# Patient Record
Sex: Female | Born: 1935 | Race: White | Hispanic: No | Marital: Married | Smoking: Former smoker
Health system: Southern US, Community
[De-identification: ages and names within clinical notes are randomized; demographics above are authoritative.]

## PROBLEM LIST (undated history)

## (undated) DIAGNOSIS — H269 Unspecified cataract: Secondary | ICD-10-CM

## (undated) DIAGNOSIS — Z86010 Personal history of colonic polyps: Secondary | ICD-10-CM

## (undated) DIAGNOSIS — N390 Urinary tract infection, site not specified: Secondary | ICD-10-CM

## (undated) DIAGNOSIS — J449 Chronic obstructive pulmonary disease, unspecified: Secondary | ICD-10-CM

## (undated) DIAGNOSIS — E079 Disorder of thyroid, unspecified: Secondary | ICD-10-CM

## (undated) DIAGNOSIS — Z860101 Personal history of adenomatous and serrated colon polyps: Secondary | ICD-10-CM

## (undated) DIAGNOSIS — I712 Thoracic aortic aneurysm, without rupture, unspecified: Secondary | ICD-10-CM

## (undated) DIAGNOSIS — Z8542 Personal history of malignant neoplasm of other parts of uterus: Secondary | ICD-10-CM

## (undated) DIAGNOSIS — T7840XA Allergy, unspecified, initial encounter: Secondary | ICD-10-CM

## (undated) DIAGNOSIS — I5030 Unspecified diastolic (congestive) heart failure: Secondary | ICD-10-CM

## (undated) DIAGNOSIS — F419 Anxiety disorder, unspecified: Secondary | ICD-10-CM

## (undated) DIAGNOSIS — I10 Essential (primary) hypertension: Secondary | ICD-10-CM

## (undated) DIAGNOSIS — K5900 Constipation, unspecified: Secondary | ICD-10-CM

## (undated) DIAGNOSIS — M1990 Unspecified osteoarthritis, unspecified site: Secondary | ICD-10-CM

## (undated) DIAGNOSIS — I351 Nonrheumatic aortic (valve) insufficiency: Secondary | ICD-10-CM

## (undated) DIAGNOSIS — J439 Emphysema, unspecified: Secondary | ICD-10-CM

## (undated) DIAGNOSIS — J45909 Unspecified asthma, uncomplicated: Secondary | ICD-10-CM

## (undated) DIAGNOSIS — C4490 Unspecified malignant neoplasm of skin, unspecified: Secondary | ICD-10-CM

## (undated) DIAGNOSIS — E785 Hyperlipidemia, unspecified: Secondary | ICD-10-CM

## (undated) DIAGNOSIS — I729 Aneurysm of unspecified site: Secondary | ICD-10-CM

## (undated) DIAGNOSIS — I2510 Atherosclerotic heart disease of native coronary artery without angina pectoris: Secondary | ICD-10-CM

## (undated) HISTORY — PX: TOE SURGERY: SHX1073

## (undated) HISTORY — PX: SHOULDER ARTHROSCOPY W/ CAPSULAR REPAIR: SHX2398

## (undated) HISTORY — PX: CHOLECYSTECTOMY: SHX55

## (undated) HISTORY — PX: ABDOMINAL HYSTERECTOMY: SHX81

---

## 1983-10-23 HISTORY — PX: BREAST CYST ASPIRATION: SHX578

## 2004-12-25 ENCOUNTER — Ambulatory Visit: Payer: Self-pay | Admitting: Obstetrics and Gynecology

## 2004-12-29 ENCOUNTER — Ambulatory Visit: Payer: Self-pay | Admitting: Obstetrics and Gynecology

## 2006-02-21 ENCOUNTER — Ambulatory Visit: Payer: Self-pay | Admitting: Obstetrics and Gynecology

## 2007-03-20 ENCOUNTER — Ambulatory Visit: Payer: Self-pay | Admitting: Obstetrics and Gynecology

## 2007-10-22 ENCOUNTER — Emergency Department: Payer: Self-pay | Admitting: Emergency Medicine

## 2007-10-22 ENCOUNTER — Other Ambulatory Visit: Payer: Self-pay

## 2008-05-18 ENCOUNTER — Ambulatory Visit: Payer: Self-pay | Admitting: Obstetrics and Gynecology

## 2009-05-20 ENCOUNTER — Ambulatory Visit: Payer: Self-pay | Admitting: Obstetrics and Gynecology

## 2010-06-08 ENCOUNTER — Ambulatory Visit: Payer: Self-pay | Admitting: Obstetrics and Gynecology

## 2010-08-14 ENCOUNTER — Ambulatory Visit: Payer: Self-pay | Admitting: Unknown Physician Specialty

## 2010-08-16 LAB — PATHOLOGY REPORT

## 2010-09-16 ENCOUNTER — Ambulatory Visit: Payer: Self-pay | Admitting: Internal Medicine

## 2011-09-19 ENCOUNTER — Ambulatory Visit: Payer: Self-pay | Admitting: Obstetrics and Gynecology

## 2012-04-17 ENCOUNTER — Ambulatory Visit: Payer: Self-pay | Admitting: Internal Medicine

## 2012-11-10 ENCOUNTER — Ambulatory Visit: Payer: Self-pay | Admitting: Ophthalmology

## 2012-11-18 ENCOUNTER — Ambulatory Visit: Payer: Self-pay | Admitting: Ophthalmology

## 2013-01-20 ENCOUNTER — Ambulatory Visit: Payer: Self-pay | Admitting: Ophthalmology

## 2013-02-24 ENCOUNTER — Ambulatory Visit: Payer: Self-pay | Admitting: Obstetrics and Gynecology

## 2014-01-22 ENCOUNTER — Ambulatory Visit: Payer: Self-pay | Admitting: Vascular Surgery

## 2014-04-22 ENCOUNTER — Emergency Department: Payer: Self-pay | Admitting: Emergency Medicine

## 2014-07-13 ENCOUNTER — Encounter: Payer: Self-pay | Admitting: Specialist

## 2014-07-22 ENCOUNTER — Encounter: Payer: Self-pay | Admitting: Specialist

## 2014-08-22 ENCOUNTER — Encounter: Payer: Self-pay | Admitting: Specialist

## 2014-09-01 ENCOUNTER — Ambulatory Visit: Payer: Self-pay | Admitting: Obstetrics and Gynecology

## 2014-09-21 ENCOUNTER — Encounter: Payer: Self-pay | Admitting: Specialist

## 2014-10-22 ENCOUNTER — Encounter: Payer: Self-pay | Admitting: Specialist

## 2015-01-24 ENCOUNTER — Ambulatory Visit
Admit: 2015-01-24 | Disposition: A | Discharge status: Home / Self Care | Payer: Self-pay | Attending: Family Medicine | Admitting: Family Medicine

## 2015-02-11 NOTE — Op Note (Signed)
PATIENT NAMEJELENA, Danielle Williamson MR#:  [id number] DATE OF BIRTH:  [birth year]  DATE OF PROCEDURE:  01/20/2013  PREOPERATIVE DIAGNOSIS:  Visually significant cataract of the right eye.   POSTOPERATIVE DIAGNOSIS:  Visually significant cataract of the right eye.   OPERATIVE PROCEDURE:  Cataract extraction by phacoemulsification with implant of intraocular lens to the right eye.   SURGEON:  Birder Robson, MD  ANESTHESIA:  1. Managed anesthesia care.  2. 50-50 mixture of 0.75% bupivacaine and 4% Xylocaine given as a retrobulbar block.   COMPLICATIONS:  None.   TECHNIQUE:  Stop-and-chop.  DESCRIPTION OF PROCEDURE:  The patient was examined and consented for this procedure in the preoperative holding area and then brought back to the Operating Room where the anesthesia team employed managed anesthesia care.  3.5 milliliters of the aforementioned mixture were placed in the right orbit on an Atkinson needle without complication. The right eye was then prepped and draped in the usual sterile ophthalmic fashion. A lid speculum was placed. The side-port blade was used to create a paracentesis and the anterior chamber was filled with viscoelastic. The keratome was used to create a near clear corneal incision. The continuous curvilinear capsulorrhexis was performed with a cystotome followed by the capsulorrhexis forceps. Hydrodissection and hydrodelineation were carried out with BSS on a blunt cannula. The lens was removed in a stop-and-chop technique. The remaining cortical material was removed with the irrigation-aspiration handpiece. The capsular bag was inflated with viscoelastic and the Tecnis ZCB00 22.0-diopter lens, serial number [id number] was placed in the capsular bag without complication. The remaining viscoelastic was removed from the eye with the irrigation-aspiration handpiece. The wounds were hydrated. The anterior chamber was flushed with Miostat and the eye was inflated to a physiologic  pressure. Intracameral cefuroxime was not used due to a severe penicillin allergy. The wounds were found to be water tight. The eye was dressed with Vigamox followed by Maxitrol ointment and a protective shield was placed. The patient will follow up with me in one day.    ____________________________ [name]. Quetzally Callas, MD wlp:si D: 01/20/2013 22:03:50 ET T: 01/21/2013 00:21:11 ET JOB#: [id number]  cc: Brodey Bonn L. Johnna Bollier, MD, <Dictator> [name] Amiaya Mcneeley MD ELECTRONICALLY SIGNED 01/21/2013 10:30

## 2015-02-11 NOTE — Op Note (Signed)
PATIENT NAMEMURIAH, Danielle Williamson MR#:  [id number] DATE OF BIRTH:  Feb 11, 1932  DATE OF PROCEDURE:  11/18/2012  PREOPERATIVE DIAGNOSIS: Visually significant cataract of the left eye.   POSTOPERATIVE DIAGNOSIS: Visually significant cataract of the left eye.   OPERATIVE PROCEDURE: Cataract extraction by phacoemulsification with implant of intraocular lens to the left eye.   SURGEON: Birder Robson, MD  ANESTHESIA:  1. Managed anesthesia care.  2. 50-50 mixture of 0.75% bupivacaine and 4% Xylocaine given as a retrobulbar block.   COMPLICATIONS: None.   TECHNIQUE:  Stop and chop.   DESCRIPTION OF PROCEDURE: The patient was examined and consented for this procedure in the preoperative holding area and then brought back to the Operating Room where the anesthesia team employed managed anesthesia care.  3.5 milliliters of the aforementioned mixture were placed in the left orbit on an Atkinson needle without complication. The left  eye was then prepped and draped in the usual sterile ophthalmic fashion. A lid speculum was placed. The side-port blade was used to create a paracentesis and the anterior chamber was filled with viscoelastic. The keratome was used to create a near clear corneal incision. The continuous curvilinear capsulorrhexis was performed with a cystotome followed by the capsulorrhexis forceps. Hydrodissection and hydrodelineation were carried out with BSS on a blunt cannula. The lens was removed in a stop and chop technique. The remaining cortical material was removed with the irrigation-aspiration handpiece. The capsular bag was inflated with viscoelastic and the ZCB00 22.5-diopter lens, serial number [id number] was placed in the capsular bag without complication. The remaining viscoelastic was removed from the eye with the irrigation-aspiration handpiece. The wounds were hydrated. The anterior chamber was flushed with Miostat and the eye was inflated to a physiologic pressure.  The wounds  were found to be water tight. The eye was dressed with Vigamox followed by Maxitrol ointment and a protective shield was placed. The patient will followup with me in one day.   ____________________________ [name]. [name], MD wlp:sb D: 11/18/2012 15:48:24 ET T: 11/18/2012 16:36:01 ET JOB#: [id number]  cc: [name], MD, <Dictator> [name] [name] MD ELECTRONICALLY SIGNED 11/27/2012 14:27

## 2015-03-01 ENCOUNTER — Other Ambulatory Visit: Payer: Self-pay | Admitting: Nurse Practitioner

## 2015-03-02 ENCOUNTER — Other Ambulatory Visit: Payer: Self-pay | Admitting: Nurse Practitioner

## 2015-03-02 DIAGNOSIS — R195 Other fecal abnormalities: Secondary | ICD-10-CM

## 2015-03-07 ENCOUNTER — Ambulatory Visit
Admission: RE | Admit: 2015-03-07 | Discharge: 2015-03-07 | Disposition: A | Discharge status: Home / Self Care | Payer: Medicare Other | Source: Ambulatory Visit | Attending: Nurse Practitioner | Admitting: Nurse Practitioner

## 2015-03-07 DIAGNOSIS — R195 Other fecal abnormalities: Secondary | ICD-10-CM | POA: Insufficient documentation

## 2015-03-07 DIAGNOSIS — R1011 Right upper quadrant pain: Secondary | ICD-10-CM | POA: Diagnosis present

## 2015-08-24 ENCOUNTER — Other Ambulatory Visit: Payer: Self-pay | Admitting: Obstetrics and Gynecology

## 2015-08-24 DIAGNOSIS — Z1231 Encounter for screening mammogram for malignant neoplasm of breast: Secondary | ICD-10-CM

## 2015-09-07 ENCOUNTER — Ambulatory Visit
Admission: RE | Admit: 2015-09-07 | Discharge: 2015-09-07 | Disposition: A | Discharge status: Home / Self Care | Payer: Medicare Other | Source: Ambulatory Visit | Attending: Obstetrics and Gynecology | Admitting: Obstetrics and Gynecology

## 2015-09-07 DIAGNOSIS — Z1231 Encounter for screening mammogram for malignant neoplasm of breast: Secondary | ICD-10-CM

## 2015-10-27 ENCOUNTER — Emergency Department: Payer: Medicare Other

## 2015-10-27 ENCOUNTER — Encounter: Payer: Self-pay | Admitting: Intensive Care

## 2015-10-27 ENCOUNTER — Inpatient Hospital Stay
Admission: EM | Admit: 2015-10-27 | Discharge: 2015-10-29 | DRG: 871 | Disposition: A | Discharge status: Home / Self Care | Payer: Medicare Other | Attending: Internal Medicine | Admitting: Internal Medicine

## 2015-10-27 DIAGNOSIS — Z87891 Personal history of nicotine dependence: Secondary | ICD-10-CM

## 2015-10-27 DIAGNOSIS — Z803 Family history of malignant neoplasm of breast: Secondary | ICD-10-CM | POA: Diagnosis not present

## 2015-10-27 DIAGNOSIS — I5032 Chronic diastolic (congestive) heart failure: Secondary | ICD-10-CM | POA: Diagnosis present

## 2015-10-27 DIAGNOSIS — R531 Weakness: Secondary | ICD-10-CM

## 2015-10-27 DIAGNOSIS — I110 Hypertensive heart disease with heart failure: Secondary | ICD-10-CM | POA: Diagnosis present

## 2015-10-27 DIAGNOSIS — I350 Nonrheumatic aortic (valve) stenosis: Secondary | ICD-10-CM | POA: Diagnosis present

## 2015-10-27 DIAGNOSIS — Z85828 Personal history of other malignant neoplasm of skin: Secondary | ICD-10-CM | POA: Diagnosis not present

## 2015-10-27 DIAGNOSIS — Z7952 Long term (current) use of systemic steroids: Secondary | ICD-10-CM

## 2015-10-27 DIAGNOSIS — J189 Pneumonia, unspecified organism: Secondary | ICD-10-CM | POA: Diagnosis present

## 2015-10-27 DIAGNOSIS — E785 Hyperlipidemia, unspecified: Secondary | ICD-10-CM | POA: Diagnosis present

## 2015-10-27 DIAGNOSIS — R079 Chest pain, unspecified: Secondary | ICD-10-CM

## 2015-10-27 DIAGNOSIS — Z9049 Acquired absence of other specified parts of digestive tract: Secondary | ICD-10-CM

## 2015-10-27 DIAGNOSIS — R0781 Pleurodynia: Secondary | ICD-10-CM

## 2015-10-27 DIAGNOSIS — E039 Hypothyroidism, unspecified: Secondary | ICD-10-CM | POA: Diagnosis present

## 2015-10-27 DIAGNOSIS — I712 Thoracic aortic aneurysm, without rupture: Secondary | ICD-10-CM | POA: Diagnosis present

## 2015-10-27 DIAGNOSIS — F419 Anxiety disorder, unspecified: Secondary | ICD-10-CM | POA: Diagnosis present

## 2015-10-27 DIAGNOSIS — Z9981 Dependence on supplemental oxygen: Secondary | ICD-10-CM | POA: Diagnosis not present

## 2015-10-27 DIAGNOSIS — I2510 Atherosclerotic heart disease of native coronary artery without angina pectoris: Secondary | ICD-10-CM | POA: Diagnosis present

## 2015-10-27 DIAGNOSIS — Z90710 Acquired absence of both cervix and uterus: Secondary | ICD-10-CM

## 2015-10-27 DIAGNOSIS — J45909 Unspecified asthma, uncomplicated: Secondary | ICD-10-CM | POA: Diagnosis present

## 2015-10-27 DIAGNOSIS — Z8542 Personal history of malignant neoplasm of other parts of uterus: Secondary | ICD-10-CM

## 2015-10-27 DIAGNOSIS — J181 Lobar pneumonia, unspecified organism: Secondary | ICD-10-CM

## 2015-10-27 DIAGNOSIS — J440 Chronic obstructive pulmonary disease with acute lower respiratory infection: Secondary | ICD-10-CM | POA: Diagnosis present

## 2015-10-27 DIAGNOSIS — Z86010 Personal history of colonic polyps: Secondary | ICD-10-CM

## 2015-10-27 DIAGNOSIS — R0600 Dyspnea, unspecified: Secondary | ICD-10-CM

## 2015-10-27 DIAGNOSIS — A419 Sepsis, unspecified organism: Principal | ICD-10-CM | POA: Diagnosis present

## 2015-10-27 DIAGNOSIS — D72829 Elevated white blood cell count, unspecified: Secondary | ICD-10-CM

## 2015-10-27 DIAGNOSIS — Z09 Encounter for follow-up examination after completed treatment for conditions other than malignant neoplasm: Secondary | ICD-10-CM

## 2015-10-27 DIAGNOSIS — R509 Fever, unspecified: Secondary | ICD-10-CM | POA: Diagnosis present

## 2015-10-27 HISTORY — DX: Disorder of thyroid, unspecified: E07.9

## 2015-10-27 HISTORY — DX: Personal history of adenomatous and serrated colon polyps: Z86.0101

## 2015-10-27 HISTORY — DX: Constipation, unspecified: K59.00

## 2015-10-27 HISTORY — DX: Unspecified cataract: H26.9

## 2015-10-27 HISTORY — DX: Unspecified asthma, uncomplicated: J45.909

## 2015-10-27 HISTORY — DX: Aneurysm of unspecified site: I72.9

## 2015-10-27 HISTORY — DX: Anxiety disorder, unspecified: F41.9

## 2015-10-27 HISTORY — DX: Unspecified malignant neoplasm of skin, unspecified: C44.90

## 2015-10-27 HISTORY — DX: Urinary tract infection, site not specified: N39.0

## 2015-10-27 HISTORY — DX: Allergy, unspecified, initial encounter: T78.40XA

## 2015-10-27 HISTORY — DX: Unspecified diastolic (congestive) heart failure: I50.30

## 2015-10-27 HISTORY — DX: Thoracic aortic aneurysm, without rupture: I71.2

## 2015-10-27 HISTORY — DX: Atherosclerotic heart disease of native coronary artery without angina pectoris: I25.10

## 2015-10-27 HISTORY — DX: Essential (primary) hypertension: I10

## 2015-10-27 HISTORY — DX: Nonrheumatic aortic (valve) insufficiency: I35.1

## 2015-10-27 HISTORY — DX: Emphysema, unspecified: J43.9

## 2015-10-27 HISTORY — DX: Personal history of colonic polyps: Z86.010

## 2015-10-27 HISTORY — DX: Hyperlipidemia, unspecified: E78.5

## 2015-10-27 HISTORY — DX: Thoracic aortic aneurysm, without rupture, unspecified: I71.20

## 2015-10-27 HISTORY — DX: Unspecified osteoarthritis, unspecified site: M19.90

## 2015-10-27 HISTORY — DX: Chronic obstructive pulmonary disease, unspecified: J44.9

## 2015-10-27 HISTORY — DX: Personal history of malignant neoplasm of other parts of uterus: Z85.42

## 2015-10-27 LAB — COMPREHENSIVE METABOLIC PANEL
ALBUMIN: 3.7 g/dL (ref 3.5–5.0)
ALK PHOS: 48 U/L (ref 38–126)
ALT: 16 U/L (ref 14–54)
AST: 18 U/L (ref 15–41)
Anion gap: 10 (ref 5–15)
BILIRUBIN TOTAL: 1.4 mg/dL — AB (ref 0.3–1.2)
BUN: 16 mg/dL (ref 6–20)
CO2: 27 mmol/L (ref 22–32)
CREATININE: 0.87 mg/dL (ref 0.44–1.00)
Calcium: 8.8 mg/dL — ABNORMAL LOW (ref 8.9–10.3)
Chloride: 100 mmol/L — ABNORMAL LOW (ref 101–111)
GFR calc Af Amer: >60 mL/min (ref >60)
GLUCOSE: 93 mg/dL (ref 65–99)
POTASSIUM: 3.9 mmol/L (ref 3.5–5.1)
Sodium: 137 mmol/L (ref 135–145)
TOTAL PROTEIN: 6.7 g/dL (ref 6.5–8.1)

## 2015-10-27 LAB — CBC WITH DIFFERENTIAL/PLATELET
BASOS ABS: 0.1 10*3/uL (ref 0–0.1)
Basophils Relative: 1 %
Eosinophils Absolute: 0.2 10*3/uL (ref 0–0.7)
Eosinophils Relative: 1 %
HEMATOCRIT: 41.8 % (ref 35.0–47.0)
Hemoglobin: 13.8 g/dL (ref 12.0–16.0)
LYMPHS PCT: 3 %
Lymphs Abs: 0.6 10*3/uL — ABNORMAL LOW (ref 1.0–3.6)
MCH: 30.2 pg (ref 26.0–34.0)
MCHC: 33.1 g/dL (ref 32.0–36.0)
MCV: 91.1 fL (ref 80.0–100.0)
MONO ABS: 1.8 10*3/uL — AB (ref 0.2–0.9)
Monocytes Relative: 9 %
NEUTROS ABS: 16.5 10*3/uL — AB (ref 1.4–6.5)
Neutrophils Relative %: 86 %
Platelets: 220 10*3/uL (ref 150–440)
RBC: 4.59 MIL/uL (ref 3.80–5.20)
RDW: 13.2 % (ref 11.5–14.5)
WBC: 19.1 10*3/uL — ABNORMAL HIGH (ref 3.6–11.0)

## 2015-10-27 LAB — URINALYSIS COMPLETE WITH MICROSCOPIC (ARMC ONLY)
BILIRUBIN URINE: NEGATIVE
Bacteria, UA: NONE SEEN
Glucose, UA: NEGATIVE mg/dL
HGB URINE DIPSTICK: NEGATIVE
LEUKOCYTES UA: NEGATIVE
Nitrite: NEGATIVE
PH: 8 (ref 5.0–8.0)
Protein, ur: NEGATIVE mg/dL
Specific Gravity, Urine: 1.014 (ref 1.005–1.030)

## 2015-10-27 LAB — RAPID INFLUENZA A&B ANTIGENS (ARMC ONLY): INFLUENZA B (ARMC): NEGATIVE

## 2015-10-27 LAB — LACTIC ACID, PLASMA
LACTIC ACID, VENOUS: 0.9 mmol/L (ref 0.5–2.0)
Lactic Acid, Venous: 1.2 mmol/L (ref 0.5–2.0)

## 2015-10-27 LAB — RAPID INFLUENZA A&B ANTIGENS: Influenza A (ARMC): NEGATIVE

## 2015-10-27 MED ORDER — OXYCODONE HCL 5 MG PO TABS: 5.0000 mg | ORAL_TABLET | ORAL | Status: DC | PRN

## 2015-10-27 MED ORDER — ATORVASTATIN CALCIUM 20 MG PO TABS
20.0000 mg | ORAL_TABLET | Freq: Every day | ORAL | Status: DC
  Administered 2015-10-27 – 2015-10-29 (×3): 20 mg via ORAL
  Filled 2015-10-27 (×3): qty 1

## 2015-10-27 MED ORDER — FLUTICASONE PROPIONATE HFA 220 MCG/ACT IN AERO
1.0000 | INHALATION_SPRAY | Freq: Two times a day (BID) | RESPIRATORY_TRACT | Status: DC
Start: 2015-10-27 — End: 2015-10-27

## 2015-10-27 MED ORDER — ONDANSETRON HCL 4 MG/2ML IJ SOLN: 4.0000 mg | Freq: Four times a day (QID) | INTRAMUSCULAR | Status: DC | PRN

## 2015-10-27 MED ORDER — ACETAMINOPHEN 650 MG RE SUPP: 650.0000 mg | Freq: Four times a day (QID) | RECTAL | Status: DC | PRN

## 2015-10-27 MED ORDER — IPRATROPIUM-ALBUTEROL 0.5-2.5 (3) MG/3ML IN SOLN
3.0000 mL | RESPIRATORY_TRACT | Status: DC | PRN
  Administered 2015-10-29: 06:00:00 3 mL via RESPIRATORY_TRACT
  Filled 2015-10-27 (×2): qty 3

## 2015-10-27 MED ORDER — ACETAMINOPHEN 325 MG PO TABS
650.0000 mg | ORAL_TABLET | Freq: Four times a day (QID) | ORAL | Status: DC | PRN
  Administered 2015-10-27 – 2015-10-28 (×2): 650 mg via ORAL
  Filled 2015-10-27 (×2): qty 2

## 2015-10-27 MED ORDER — SODIUM CHLORIDE 0.9 % IV BOLUS (SEPSIS)
1000.0000 mL | INTRAVENOUS | Status: AC
  Administered 2015-10-27 (×2): 1000 mL via INTRAVENOUS

## 2015-10-27 MED ORDER — ALPRAZOLAM 0.25 MG PO TABS
0.2500 mg | ORAL_TABLET | Freq: Three times a day (TID) | ORAL | Status: DC | PRN
  Administered 2015-10-27 – 2015-10-28 (×2): 0.25 mg via ORAL
  Filled 2015-10-27 (×2): qty 1

## 2015-10-27 MED ORDER — METHYLPREDNISOLONE SODIUM SUCC 125 MG IJ SOLR
125.0000 mg | Freq: Once | INTRAMUSCULAR | Status: AC
  Administered 2015-10-27: 125 mg via INTRAVENOUS
  Filled 2015-10-27: qty 2

## 2015-10-27 MED ORDER — BUDESONIDE 0.5 MG/2ML IN SUSP
0.5000 mg | Freq: Two times a day (BID) | RESPIRATORY_TRACT | Status: DC
Start: 2015-10-27 — End: 2015-10-29
  Administered 2015-10-27 – 2015-10-29 (×4): 0.5 mg via RESPIRATORY_TRACT
  Filled 2015-10-27 (×4): qty 2

## 2015-10-27 MED ORDER — VITAMIN D 1000 UNITS PO TABS
5000.0000 [IU] | ORAL_TABLET | Freq: Every day | ORAL | Status: DC
  Administered 2015-10-27 – 2015-10-29 (×3): 5000 [IU] via ORAL
  Filled 2015-10-27 (×3): qty 5

## 2015-10-27 MED ORDER — MUPIROCIN 2 % EX OINT
1.0000 "application " | TOPICAL_OINTMENT | Freq: Two times a day (BID) | CUTANEOUS | Status: DC
  Filled 2015-10-27: qty 22

## 2015-10-27 MED ORDER — POLYETHYLENE GLYCOL 3350 17 G PO PACK: 17.0000 g | PACK | Freq: Every day | ORAL | Status: DC | PRN

## 2015-10-27 MED ORDER — VANCOMYCIN HCL IN DEXTROSE 1-5 GM/200ML-% IV SOLN
1000.0000 mg | Freq: Once | INTRAVENOUS | Status: AC
  Administered 2015-10-27: 1000 mg via INTRAVENOUS
  Filled 2015-10-27: qty 200

## 2015-10-27 MED ORDER — ONDANSETRON HCL 4 MG PO TABS: 4.0000 mg | ORAL_TABLET | Freq: Four times a day (QID) | ORAL | Status: DC | PRN

## 2015-10-27 MED ORDER — SODIUM CHLORIDE 0.9 % IV SOLN
INTRAVENOUS | Status: DC
  Administered 2015-10-27 – 2015-10-28 (×3): via INTRAVENOUS

## 2015-10-27 MED ORDER — IOHEXOL 350 MG/ML SOLN
100.0000 mL | Freq: Once | INTRAVENOUS | Status: AC | PRN
  Administered 2015-10-27: 100 mL via INTRAVENOUS

## 2015-10-27 MED ORDER — AMLODIPINE BESYLATE 5 MG PO TABS
5.0000 mg | ORAL_TABLET | Freq: Every day | ORAL | Status: DC
  Administered 2015-10-27 – 2015-10-29 (×3): 5 mg via ORAL
  Filled 2015-10-27 (×3): qty 1

## 2015-10-27 MED ORDER — PREDNISONE 5 MG PO TABS
5.0000 mg | ORAL_TABLET | Freq: Every day | ORAL | Status: DC
  Administered 2015-10-27 – 2015-10-29 (×3): 5 mg via ORAL
  Filled 2015-10-27 (×4): qty 1

## 2015-10-27 MED ORDER — LEVOTHYROXINE SODIUM 75 MCG PO TABS
75.0000 ug | ORAL_TABLET | Freq: Every day | ORAL | Status: DC
  Administered 2015-10-28 – 2015-10-29 (×2): 75 ug via ORAL
  Filled 2015-10-27 (×3): qty 1

## 2015-10-27 MED ORDER — DEXTROSE 5 % IV SOLN
2.0000 g | Freq: Once | INTRAVENOUS | Status: AC
  Administered 2015-10-27: 2 g via INTRAVENOUS
  Filled 2015-10-27: qty 2

## 2015-10-27 MED ORDER — DEXTROSE 5 % IV SOLN
1.0000 g | INTRAVENOUS | Status: DC
  Administered 2015-10-28 – 2015-10-29 (×2): 1 g via INTRAVENOUS
  Filled 2015-10-27 (×2): qty 10

## 2015-10-27 MED ORDER — DEXTROSE 5 % IV SOLN
500.0000 mg | INTRAVENOUS | Status: DC
  Administered 2015-10-27 – 2015-10-28 (×2): 500 mg via INTRAVENOUS
  Filled 2015-10-27 (×3): qty 500

## 2015-10-27 MED ORDER — TIOTROPIUM BROMIDE MONOHYDRATE 18 MCG IN CAPS
18.0000 ug | ORAL_CAPSULE | Freq: Every day | RESPIRATORY_TRACT | Status: DC
  Administered 2015-10-27 – 2015-10-29 (×3): 18 ug via RESPIRATORY_TRACT
  Filled 2015-10-27: qty 5

## 2015-10-27 MED ORDER — DOCUSATE SODIUM 100 MG PO CAPS
100.0000 mg | ORAL_CAPSULE | Freq: Two times a day (BID) | ORAL | Status: DC
  Administered 2015-10-27 – 2015-10-29 (×2): 100 mg via ORAL
  Filled 2015-10-27 (×5): qty 1

## 2015-10-27 MED ORDER — DIPHENHYDRAMINE HCL 50 MG/ML IJ SOLN
50.0000 mg | Freq: Once | INTRAMUSCULAR | Status: AC
  Administered 2015-10-27: 50 mg via INTRAVENOUS
  Filled 2015-10-27: qty 1

## 2015-10-27 MED ORDER — MORPHINE SULFATE (PF) 2 MG/ML IV SOLN: 2.0000 mg | INTRAVENOUS | Status: DC | PRN

## 2015-10-27 MED ORDER — ENOXAPARIN SODIUM 40 MG/0.4ML ~~LOC~~ SOLN
40.0000 mg | SUBCUTANEOUS | Status: DC
  Administered 2015-10-27 – 2015-10-28 (×2): 40 mg via SUBCUTANEOUS
  Filled 2015-10-27 (×2): qty 0.4

## 2015-10-27 MED ORDER — HYDROCOD POLST-CPM POLST ER 10-8 MG/5ML PO SUER: 5.0000 mL | Freq: Two times a day (BID) | ORAL | Status: DC | PRN

## 2015-10-27 NOTE — Progress Notes (Signed)
ANTIBIOTIC CONSULT NOTE - INITIAL  Pharmacy Consult for Ceftriaxone Indication: pneumonia/sepsis  Allergies  Allergen Reactions  . Epinephrine   . Iodine     Hives per patient (10/27/15)   . Lopid [Gemfibrozil] Other (See Comments)    Reaction: Muscle pain  . Lovastatin Other (See Comments)    Reaction: Muscle pain  . Neosporin [Neomycin-Bacitracin Zn-Polymyx]   . Polysporin [Bacitracin-Polymyxin B]   . Vesicare [Solifenacin] Other (See Comments)    Reaction: Palpations  . Penicillin V Potassium Rash  . Penicillins Rash    Patient Measurements: Height: 5\' 5"  (165.1 cm) Weight: 138 lb 1.6 oz (62.642 kg) IBW/kg (Calculated) : 57  Vital Signs: Temp: 97.9 F (36.6 C) (01/05 1617) Temp Source: Oral (01/05 1617) BP: 124/49 mmHg (01/05 1617) Pulse Rate: 82 (01/05 1617) Intake/Output from previous day:   Intake/Output from this shift:    Labs:  Recent Labs  10/27/15 1102  WBC 19.1*  HGB 13.8  PLT 220  CREATININE 0.87   Estimated Creatinine Clearance: 45.6 mL/min (by C-G formula based on Cr of 0.87). No results for input(s): VANCOTROUGH, VANCOPEAK, VANCORANDOM, GENTTROUGH, GENTPEAK, GENTRANDOM, TOBRATROUGH, TOBRAPEAK, TOBRARND, AMIKACINPEAK, AMIKACINTROU, AMIKACIN in the last 72 hours.   Microbiology: Recent Results (from the past 720 hour(s))  Culture, blood (routine x 2)     Status: None (Preliminary result)   Collection Time: 10/27/15 11:47 AM  Result Value Ref Range Status   Specimen Description BLOOD RIGHT ARM  Final   Special Requests   Final    BOTTLES DRAWN AEROBIC AND ANAEROBIC ANA 5CC AERO [place]   Culture NO GROWTH < 12 HOURS  Final   Report Status PENDING  Incomplete  Urine culture     Status: None (Preliminary result)   Collection Time: 10/27/15 11:47 AM  Result Value Ref Range Status   Specimen Description URINE, RANDOM  Final   Special Requests NONE  Final   Culture NO GROWTH < 12 HOURS  Final   Report Status PENDING  Incomplete  Rapid Influenza  A&B Antigens ([facility] only)     Status: None   Collection Time: 10/27/15 11:47 AM  Result Value Ref Range Status   Influenza A ([facility]) NEGATIVE  Final   Influenza B ([facility]) NEGATIVE  Final  Culture, blood (routine x 2)     Status: None (Preliminary result)   Collection Time: 10/27/15 12:09 PM  Result Value Ref Range Status   Specimen Description BLOOD LEFT ASSIST CONTROL  Final   Special Requests BOTTLES DRAWN AEROBIC AND ANAEROBIC 1CC  Final   Culture NO GROWTH < 12 HOURS  Final   Report Status PENDING  Incomplete    Medical History: Past Medical History  Diagnosis Date  . COPD (chronic obstructive pulmonary disease) (HCC)     on nocturnal o2  . Asthma   . Hypertension   . UTI (lower urinary tract infection)   . Aneurysm ([place])     thoracic aortic aneurysm  . Pulmonary emphysema ([place])   . Aortic insufficiency   . Aneurysm, thoracic aortic ([place])   . CAD (coronary artery disease)     no h/o stents or CABG  . Hyperlipidemia   . Thyroid disease     hypo  . Constipation   . History of adenomatous polyp of colon   . Skin cancer   . History of uterine cancer   . Diastolic CHF (HCC)     Medications:  Scheduled:  . amLODipine  5 mg Oral Daily  . atorvastatin  20 mg Oral Daily  . azithromycin  500 mg Intravenous Q24H  . [START ON 10/28/2015] cefTRIAXone (ROCEPHIN)  IV  1 g Intravenous Q24H  . cholecalciferol  5,000 Units Oral Daily  . docusate sodium  100 mg Oral BID  . enoxaparin (LOVENOX) injection  40 mg Subcutaneous Q24H  . fluticasone  1 puff Inhalation BID  . [START ON 10/28/2015] levothyroxine  75 mcg Oral QAC breakfast  . mupirocin ointment  1 application Nasal BID  . predniSONE  5 mg Oral Daily  . tiotropium  18 mcg Inhalation Daily   Infusions:  . sodium chloride 75 mL/hr at 10/27/15 1621   Assessment: 82 y/o F admitted with sepsis from CAP. Patient received cefepime in ED and is now ordered azithromycin and ceftriaxone.   Plan:  Will begin ceftriaxone 1 g iv q 24  hours from tomorrow.   Pharmacy will continue to follow.   Ulice Dash D 10/27/2015,4:22 PM

## 2015-10-27 NOTE — H&P (Signed)
[facility] NAME: Jaelyne Loiselle    MR#:  [id number]  DATE OF BIRTH:  24-Jan-1932  DATE OF ADMISSION:  10/27/2015  PRIMARY CARE PHYSICIAN: Madelyn Brunner, MD   REQUESTING/REFERRING PHYSICIAN: Dr. Lisa Roca  CHIEF COMPLAINT:   Chief Complaint  Patient presents with  . Fever  . Generalized Body Aches    HISTORY OF PRESENT ILLNESS:  Kura Wainio  is a 82 y.o. female with a known history of COPD on nocturnal home oxygen, hypertension, asthma, thoracic tic aneurysm, aortic stenosis, diastolic CHF, hypertension and hyperlipidemia presents to the hospital secondary to worsening fevers, chills and difficulty breathing for 2 days now. Symptoms started like a viral illness about 2 days ago with significant myalgias and also chills. Patient has been having nausea with decreased appetite. Denies any vomiting, diarrhea or abdominal pain. Cough and weakness started yesterday. Symptoms did not improve this morning so she called her PCPs office and went to see him today. She was having high-grade fevers and appears significantly weak and was sent her to the emergency room. She has a fever of 10 37F, elevated white count of 19,000. CT chest showed significant right middle and lower lobe pneumonia. Flu test is negative. So patient is being admitted for sepsis.  PAST MEDICAL HISTORY:   Past Medical History  Diagnosis Date  . COPD (chronic obstructive pulmonary disease) (HCC)     on nocturnal o2  . Asthma   . Hypertension   . UTI (lower urinary tract infection)   . Aneurysm ([place])     thoracic aortic aneurysm  . Pulmonary emphysema ([place])   . Aortic insufficiency   . Aneurysm, thoracic aortic ([place])   . CAD (coronary artery disease)     no h/o stents or CABG  . Hyperlipidemia   . Thyroid disease     hypo  . Constipation   . History of adenomatous polyp of colon   . Skin cancer   . History of uterine cancer   . Diastolic CHF ([place])      PAST SURGICAL HISTORY:   Past Surgical History  Procedure Laterality Date  . Breast cyst aspiration Left 1985    neg  . Cholecystectomy    . Abdominal hysterectomy    . Shoulder arthroscopy w/ capsular repair      Left  . Toe surgery      Right toe surgery    SOCIAL HISTORY:   Social History  Substance Use Topics  . Smoking status: Former Smoker -- 20 years  . Smokeless tobacco: Never Used     Comment: Quit 2 years ago.   . Alcohol Use: 0.0 oz/week    0 Standard drinks or equivalent per week     Comment: rarely    FAMILY HISTORY:   Family History  Problem Relation Age of Onset  . Breast cancer Cousin     DRUG ALLERGIES:   Allergies  Allergen Reactions  . Epinephrine   . Iodine     Hives per patient (10/27/15)   . Lopid [Gemfibrozil] Other (See Comments)    Reaction: Muscle pain  . Lovastatin Other (See Comments)    Reaction: Muscle pain  . Neosporin [Neomycin-Bacitracin Zn-Polymyx]   . Polysporin [Bacitracin-Polymyxin B]   . Vesicare [Solifenacin] Other (See Comments)    Reaction: Palpations  . Penicillin V Potassium Rash  . Penicillins Rash    REVIEW OF SYSTEMS:   Review of Systems  Constitutional:  Positive for fever, chills and malaise/fatigue. Negative for weight loss.  HENT: Negative for ear discharge, ear pain, nosebleeds and tinnitus.   Eyes: Negative for blurred vision, double vision and photophobia.  Respiratory: Positive for cough and shortness of breath. Negative for hemoptysis and wheezing.   Cardiovascular: Positive for chest pain. Negative for palpitations, orthopnea and leg swelling.  Gastrointestinal: Positive for nausea. Negative for heartburn, vomiting, abdominal pain, diarrhea, constipation and melena.  Genitourinary: Negative for dysuria, urgency, frequency and hematuria.  Musculoskeletal: Positive for myalgias and back pain. Negative for neck pain.  Skin: Negative for rash.  Neurological: Positive for headaches. Negative for  dizziness, tingling, tremors, sensory change, speech change and focal weakness.  Endo/Heme/Allergies: Does not bruise/bleed easily.  Psychiatric/Behavioral: Negative for depression.    MEDICATIONS AT HOME:   Prior to Admission medications   Medication Sig Start Date End Date Taking? Authorizing Provider  albuterol (PROVENTIL HFA;VENTOLIN HFA) 108 (90 Base) MCG/ACT inhaler Inhale 2 puffs into the lungs every 6 (six) hours as needed for wheezing.   Yes Historical Provider, MD  albuterol (PROVENTIL) (2.5 MG/3ML) 0.083% nebulizer solution Take 2.5 mg by nebulization every 6 (six) hours as needed for wheezing.   Yes Historical Provider, MD  ALPRAZolam (XANAX) 0.25 MG tablet Take 0.25 mg by mouth 3 (three) times daily as needed for anxiety.   Yes Historical Provider, MD  amLODipine (NORVASC) 5 MG tablet Take 1 tablet by mouth daily. 10/10/15  Yes Historical Provider, MD  atorvastatin (LIPITOR) 20 MG tablet Take 1 tablet by mouth daily. 08/08/15  Yes Historical Provider, MD  Cholecalciferol (VITAMIN D3) 5000 units TABS Take 1 tablet by mouth daily.   Yes Historical Provider, MD  fluticasone (FLOVENT HFA) 220 MCG/ACT inhaler Inhale 1 puff into the lungs 2 (two) times daily.   Yes Historical Provider, MD  furosemide (LASIX) 20 MG tablet Take 1 tablet by mouth daily. 09/14/15  Yes Historical Provider, MD  hydrocortisone 2.5 % cream Apply 1 application topically 2 (two) times daily.   Yes Historical Provider, MD  ketoconazole (NIZORAL) 2 % shampoo Apply 1 application topically 2 (two) times a week.   Yes Historical Provider, MD  levothyroxine (SYNTHROID, LEVOTHROID) 75 MCG tablet Take 1 tablet by mouth daily. 10/10/15  Yes Historical Provider, MD  mupirocin ointment (BACTROBAN) 2 % Place 1 application into the nose 2 (two) times daily.   Yes Historical Provider, MD  potassium chloride SA (K-DUR,KLOR-CON) 20 MEQ tablet Take 1 tablet by mouth daily. 09/14/15  Yes Historical Provider, MD  predniSONE  (DELTASONE) 5 MG tablet Take 1 tablet by mouth daily. 10/25/15  Yes Historical Provider, MD  SPIRIVA RESPIMAT 2.5 MCG/ACT AERS Take 2 puffs by mouth daily. 10/25/15  Yes Historical Provider, MD      VITAL SIGNS:  Blood pressure 169/68, pulse 102, temperature 101.1 F (38.4 C), temperature source Oral, resp. rate 18, height 5\' 5"  (1.651 m), weight 62.642 kg (138 lb 1.6 oz), SpO2 95 %.  PHYSICAL EXAMINATION:   Physical Exam  GENERAL:  82 y.o.-year-old patient lying in the bed with no acute distress.  EYES: Pupils equal, round, reactive to light and accommodation. No scleral icterus. Extraocular muscles intact.  HEENT: Head atraumatic, normocephalic. Oropharynx and nasopharynx clear.  NECK:  Supple, no jugular venous distention. No thyroid enlargement, no tenderness.  LUNGS: Normal breath sounds bilaterally, decreased bibasilar breath sounds, some scattered rhonchi at the bases. no wheezing, rales or crepitation. No use of accessory muscles of respiration.  CARDIOVASCULAR: S1, S2 normal.  No rubs, or gallops. 3/6 systolic murmur present ABDOMEN: Soft, nontender, nondistended. Bowel sounds present. No organomegaly or mass.  EXTREMITIES: No pedal edema, cyanosis, or clubbing.  NEUROLOGIC: Cranial nerves II through XII are intact. Muscle strength 5/5 in all extremities. Sensation intact. Gait not checked. Generalized weakness and myalgias. PSYCHIATRIC: The patient is alert and oriented x 3.  SKIN: No obvious rash, lesion, or ulcer.   LABORATORY PANEL:   CBC  Recent Labs Lab 10/27/15 1102  WBC 19.1*  HGB 13.8  HCT 41.8  PLT 220   ------------------------------------------------------------------------------------------------------------------  Chemistries   Recent Labs Lab 10/27/15 1102  NA 137  K 3.9  CL 100*  CO2 27  GLUCOSE 93  BUN 16  CREATININE 0.87  CALCIUM 8.8*  AST 18  ALT 16  ALKPHOS 48  BILITOT 1.4*    ------------------------------------------------------------------------------------------------------------------  Cardiac Enzymes No results for input(s): TROPONINI in the last 168 hours. ------------------------------------------------------------------------------------------------------------------  RADIOLOGY:  Dg Chest 2 View  10/27/2015  CLINICAL DATA:  Code sepsis. EXAM: CHEST  2 VIEW COMPARISON:  Chest CT 01/22/2014 FINDINGS: There is no edema, consolidation, effusion, or pneumothorax. Normal heart size. Descending thoracic aorta bulge in the lateral projection correlating with aneurysm seen on 01/22/2014. Size comparison is of limited utility given magnification and partial visualization of aortic margins. IMPRESSION: 1. No explanation for sepsis. 2. Known descending aortic aneurysm. Electronically Signed   By: Monte Fantasia M.D.   On: 10/27/2015 11:51   Ct Angio Chest Aorta W/cm &/or Wo/cm  10/27/2015  CLINICAL DATA:  Chest pain. Fever and generalized body aches for 2 days. EXAM: CT ANGIOGRAPHY CHEST, ABDOMEN AND PELVIS TECHNIQUE: Multidetector CT imaging through the chest, abdomen and pelvis was performed using the standard protocol during bolus administration of intravenous contrast. Multiplanar reconstructed images and MIPs were obtained and reviewed to evaluate the vascular anatomy. CONTRAST:  168mL OMNIPAQUE IOHEXOL 350 MG/ML SOLN COMPARISON:  Chest CT 01/22/2014 FINDINGS: CTA CHEST FINDINGS THORACIC INLET/BODY WALL: No acute abnormality. MEDIASTINUM: Normal heart size and no pericardial effusion. Mild anterior pericardial thickening is stable. Moderate aortic valve calcifications/ sclerosis. Extensive atherosclerosis, including the coronary arteries. A fusiform distal thoracic aorta aneurysm with mural thrombus measures 39 mm maximal outer wall diameter, increased by 2 mm compared to prior. There is no evidence of acute aortic syndrome including rupture, dissection, or intramural  hematoma. No plaque ulceration. No evidence of pulmonary embolism. LUNG WINDOWS: Dense consolidation in the superior and medial right lower lobe. There is broad pleural contact which could explain history of chest pain. Calcified granulomas in the right lung. No suspicious pulmonary nodule. No pleural effusion or cavitation. OSSEOUS: No acute fracture.  No suspicious lytic or blastic lesions. Review of the MIP images confirms the above findings. CTA ABDOMEN AND PELVIS FINDINGS Hepatobiliary: Heterogeneous liver density which is likely from contrast timing. There are subtle hypervascular areas, pattern also seen on comparison study and likely small shunts.Cholecystectomy. Pancreas: Coarse calcifications in the uncinate, nonspecific. No duct enlargement or active inflammation. Spleen: Unremarkable. Adrenals/Urinary Tract: Negative adrenals. No hydronephrosis or stone. Unremarkable bladder. Reproductive:Hysterectomy with negative adnexa Stomach/Bowel:  No obstruction. No appendicitis. Vascular/Lymphatic: Right hepatic artery is replaced to the SMA. Otherwise, abdominal aortic branching is standard. Diffuse atheromatous wall calcification and thickening. No aneurysm or major branch occlusion. No dissection or inflammatory wall thickening. No mass or adenopathy. Peritoneal: No ascites or pneumoperitoneum. Musculoskeletal: No acute abnormalities. Review of the MIP images confirms the above findings. IMPRESSION: 1. Right lower lobe pneumonia. The opacity is subtle  by chest x-ray but visible laterally. Followup PA and lateral chest X-ray is recommended in 3-4 weeks following trial of antibiotic therapy to ensure resolution. 2. No acute aortic finding. 3. Fusiform descending thoracic aortic aneurysm measures 39 mm in diameter, 2 mm growth since April 2015. Electronically Signed   By: Monte Fantasia M.D.   On: 10/27/2015 14:19   Ct Cta Abd/pel W/cm &/or W/o Cm  10/27/2015  CLINICAL DATA:  Chest pain. Fever and generalized  body aches for 2 days. EXAM: CT ANGIOGRAPHY CHEST, ABDOMEN AND PELVIS TECHNIQUE: Multidetector CT imaging through the chest, abdomen and pelvis was performed using the standard protocol during bolus administration of intravenous contrast. Multiplanar reconstructed images and MIPs were obtained and reviewed to evaluate the vascular anatomy. CONTRAST:  11mL OMNIPAQUE IOHEXOL 350 MG/ML SOLN COMPARISON:  Chest CT 01/22/2014 FINDINGS: CTA CHEST FINDINGS THORACIC INLET/BODY WALL: No acute abnormality. MEDIASTINUM: Normal heart size and no pericardial effusion. Mild anterior pericardial thickening is stable. Moderate aortic valve calcifications/ sclerosis. Extensive atherosclerosis, including the coronary arteries. A fusiform distal thoracic aorta aneurysm with mural thrombus measures 39 mm maximal outer wall diameter, increased by 2 mm compared to prior. There is no evidence of acute aortic syndrome including rupture, dissection, or intramural hematoma. No plaque ulceration. No evidence of pulmonary embolism. LUNG WINDOWS: Dense consolidation in the superior and medial right lower lobe. There is broad pleural contact which could explain history of chest pain. Calcified granulomas in the right lung. No suspicious pulmonary nodule. No pleural effusion or cavitation. OSSEOUS: No acute fracture.  No suspicious lytic or blastic lesions. Review of the MIP images confirms the above findings. CTA ABDOMEN AND PELVIS FINDINGS Hepatobiliary: Heterogeneous liver density which is likely from contrast timing. There are subtle hypervascular areas, pattern also seen on comparison study and likely small shunts.Cholecystectomy. Pancreas: Coarse calcifications in the uncinate, nonspecific. No duct enlargement or active inflammation. Spleen: Unremarkable. Adrenals/Urinary Tract: Negative adrenals. No hydronephrosis or stone. Unremarkable bladder. Reproductive:Hysterectomy with negative adnexa Stomach/Bowel:  No obstruction. No appendicitis.  Vascular/Lymphatic: Right hepatic artery is replaced to the SMA. Otherwise, abdominal aortic branching is standard. Diffuse atheromatous wall calcification and thickening. No aneurysm or major branch occlusion. No dissection or inflammatory wall thickening. No mass or adenopathy. Peritoneal: No ascites or pneumoperitoneum. Musculoskeletal: No acute abnormalities. Review of the MIP images confirms the above findings. IMPRESSION: 1. Right lower lobe pneumonia. The opacity is subtle by chest x-ray but visible laterally. Followup PA and lateral chest X-ray is recommended in 3-4 weeks following trial of antibiotic therapy to ensure resolution. 2. No acute aortic finding. 3. Fusiform descending thoracic aortic aneurysm measures 39 mm in diameter, 2 mm growth since April 2015. Electronically Signed   By: Monte Fantasia M.D.   On: 10/27/2015 14:19    EKG:   Orders placed or performed during the hospital encounter of 10/27/15  . EKG 12-Lead  . EKG 12-Lead    IMPRESSION AND PLAN:   Charday Ebersole  is a 82 y.o. female with a known history of COPD on nocturnal home oxygen, hypertension, asthma, thoracic tic aneurysm, aortic stenosis, diastolic CHF, hypertension and hyperlipidemia presents to the hospital secondary to worsening fevers, chills and difficulty breathing for 2 days now.  #1 sepsis-secondary to community-acquired pneumonia. -CT chest with significant right middle and lower lobe pneumonias. That will explain her right-sided pleuritic chest pain. -Follow blood cultures. Continue on Rocephin and azithromycin for now. -Follow WBC count and monitor fever curves  #2 COPD-appears stable. Continue home inhalers  and when necessary nebs treatments. -On oxygen at bedtime  #3 hypertension-continue Norvasc  #4 hyperlipidemia-on statin  #5 hypothyroidism-on Synthroid  #6 DVT prophylaxis-Lovenox    All the records are reviewed and case discussed with ED provider. Management plans discussed with the  patient, family and they are in agreement.  CODE STATUS: Full Code  TOTAL TIME TAKING CARE OF THIS PATIENT: 50 minutes.    Gladstone Lighter M.D on 10/27/2015 at 3:11 PM  Between 7am to 6pm - Pager - [phone]  After 6pm go to [email or web address] - password [email or web address] [facility]  Office  [phone]  CC: Primary care physician; Madelyn Brunner, MD

## 2015-10-27 NOTE — ED Notes (Signed)
CT made aware of solumedrol and benadryl given at this time. Pt will go for CT scan in 1 hour

## 2015-10-27 NOTE — ED Notes (Signed)
Pt presents to ER with c/o nausea, fever, and generalized body aches X2 days. Pt has HX COPD. Patient states she has 3 aneurysms

## 2015-10-27 NOTE — ED Provider Notes (Signed)
[facility] Emergency Department Provider Note   ____________________________________________  Time seen:  I have reviewed the triage vital signs and the triage nursing note.  HISTORY  Chief Complaint Fever and Generalized Body Aches   Historian Patient and sister  HPI Danielle Williamson is a 82 y.o. female who lives alone, here for 2 days of generalized body aches and now fever.  Mild upper respiratory congestion.  Mild nausea without vomiting or diarrhea.  No urinary symptoms.  She has had some lower chest/upper abd intermittent pressure.  No focal weakness or numbness.  Symptoms are moderate,  and no exacerbating or alleviating factors.    Past Medical History  Diagnosis Date  . COPD (chronic obstructive pulmonary disease) ([place])   . Asthma   . Hypertension   . UTI (lower urinary tract infection)   . Aneurysm ([place])   . Pulmonary emphysema ([place])   . Aortic insufficiency   . Aneurysm, thoracic aortic ([place])   . CAD (coronary artery disease)   . Hyperlipidemia   . Thyroid disease     hypo  . Constipation   . History of adenomatous polyp of colon   . Skin cancer   . History of uterine cancer     There are no active problems to display for this patient.   Past Surgical History  Procedure Laterality Date  . Breast cyst aspiration Left 1985    neg  . Cholecystectomy    . Abdominal hysterectomy      Current Outpatient Rx  Name  Route  Sig  Dispense  Refill  . albuterol (PROVENTIL HFA;VENTOLIN HFA) 108 (90 Base) MCG/ACT inhaler   Inhalation   Inhale 2 puffs into the lungs every 6 (six) hours as needed for wheezing.         Marland Kitchen albuterol (PROVENTIL) (2.5 MG/3ML) 0.083% nebulizer solution   Nebulization   Take 2.5 mg by nebulization every 6 (six) hours as needed for wheezing.         Marland Kitchen ALPRAZolam (XANAX) 0.25 MG tablet   Oral   Take 0.25 mg by mouth 3 (three) times daily as needed for anxiety.         Marland Kitchen amLODipine (NORVASC) 5 MG tablet  Oral   Take 1 tablet by mouth daily.      4   . atorvastatin (LIPITOR) 20 MG tablet   Oral   Take 1 tablet by mouth daily.      2   . Cholecalciferol (VITAMIN D3) 5000 units TABS   Oral   Take 1 tablet by mouth daily.         . fluticasone (FLOVENT HFA) 220 MCG/ACT inhaler   Inhalation   Inhale 1 puff into the lungs 2 (two) times daily.         . furosemide (LASIX) 20 MG tablet   Oral   Take 1 tablet by mouth daily.      5   . hydrocortisone 2.5 % cream   Topical   Apply 1 application topically 2 (two) times daily.         Marland Kitchen ketoconazole (NIZORAL) 2 % shampoo   Topical   Apply 1 application topically 2 (two) times a week.         . levothyroxine (SYNTHROID, LEVOTHROID) 75 MCG tablet   Oral   Take 1 tablet by mouth daily.      10   . mupirocin ointment (BACTROBAN) 2 %   Nasal   Place 1 application  into the nose 2 (two) times daily.         . potassium chloride SA (K-DUR,KLOR-CON) 20 MEQ tablet   Oral   Take 1 tablet by mouth daily.      5   . predniSONE (DELTASONE) 5 MG tablet   Oral   Take 1 tablet by mouth daily.      1   . SPIRIVA RESPIMAT 2.5 MCG/ACT AERS   Oral   Take 2 puffs by mouth daily.      10     Dispense as written.     Allergies Epinephrine; Iodine; Lopid; Lovastatin; Neosporin; Polysporin; Vesicare; Penicillin v potassium; and Penicillins  Family History  Problem Relation Age of Onset  . Breast cancer Cousin     Social History Social History  Substance Use Topics  . Smoking status: Former Research scientist (life sciences)  . Smokeless tobacco: Never Used  . Alcohol Use: Yes     Comment: rarely    Review of Systems  Constitutional: Positive for fever. Eyes: Negative for visual changes. ENT: Positive for sore throat. Cardiovascular: Positive for chest pain. Respiratory: Negative for shortness of breath. Gastrointestinal: Negative for abdominal pain, vomiting and diarrhea. Genitourinary: Negative for dysuria. Musculoskeletal:  Negative for back pain. Skin: Negative for rash. Neurological: Negative for headache. 10 point Review of Systems otherwise negative ____________________________________________   PHYSICAL EXAM:  VITAL SIGNS: ED Triage Vitals  Enc Vitals Group     BP 10/27/15 1039 169/68 mmHg     Pulse Rate 10/27/15 1039 102     Resp 10/27/15 1039 18     Temp 10/27/15 1039 101.1 F (38.4 C)     Temp Source 10/27/15 1039 Oral     SpO2 10/27/15 1039 95 %     Weight 10/27/15 1039 138 lb 1.6 oz (62.642 kg)     Height 10/27/15 1039 5\' 5"  (1.651 m)     Head Cir --      Peak Flow --      Pain Score 10/27/15 1040 8     Pain Loc --      Pain Edu? --      Excl. in [place]? --      Constitutional: Alert and oriented. Well appearing and in no distress. Eyes: Conjunctivae are normal. PERRL. Normal extraocular movements. ENT   Head: Normocephalic and atraumatic.   Nose: No congestion/rhinnorhea.   Mouth/Throat: Mucous membranes are mildly dry.   Neck: No stridor. Cardiovascular/Chest: Tachycardic, regular. No murmurs, rubs, or gallops. Respiratory: Normal respiratory effort without tachypnea nor retractions. Mild rhonchi posteriorly bases bilaterally with no wheezing. Gastrointestinal: Soft. No distention, no guarding, no rebound. Nontender   Genitourinary/rectal:Deferred Musculoskeletal: Nontender with normal range of motion in all extremities. No joint effusions.  No lower extremity tenderness.  No edema. Neurologic:  Normal speech and language. No gross or focal neurologic deficits are appreciated. Skin:  Skin is warm, dry and intact. No rash noted. Psychiatric: Mood and affect are normal. Speech and behavior are normal. Patient exhibits appropriate insight and judgment.  ____________________________________________   EKG I, Lisa Roca, MD, the attending physician have personally viewed and interpreted all ECGs.  95 beats) normal sinus rhythm. Narrow QRS. Left axis deviation. Normal ST  and T-wave ____________________________________________  LABS (pertinent positives/negatives)  Urinalysis 1+ ketones, 6-30 red blood cells without evidence of infection Combivent and metabolic panel without significant abnormalities White blood count 19.1 with left shift. Hemoglobin 13.8 and platelet count 220 Lactic acid 1.2 Influenza  negative ____________________________________________  RADIOLOGY All  Xrays were viewed by me. Imaging interpreted by Radiologist.  Chest x-ray two-view: No explanation for sepsis. Known descending aortic aneurysm.  CT chest abdomen pelvis angio:  IMPRESSION: 1. Right lower lobe pneumonia. The opacity is subtle by chest x-ray but visible laterally. Followup PA and lateral chest X-ray is recommended in 3-4 weeks following trial of antibiotic therapy to ensure resolution. 2. No acute aortic finding. 3. Fusiform descending thoracic aortic aneurysm measures 39 mm in diameter, 2 mm growth since April 2015. __________________________________________  PROCEDURES  Procedure(s) performed: None  Critical Care performed: None  ____________________________________________   ED COURSE / ASSESSMENT AND PLAN  CONSULTATIONS: Hospitalist for admission  Pertinent labs & imaging results that were available during my care of the patient were reviewed by me and considered in my medical decision making (see chart for details).  This patient although generally well-appearing, is febrile with tachycardia and elevated white blood cell count, raising concern for possible sepsis. No clear etiology, although the patient does seem to have some mild upper respiratory symptoms and diffuse body aches. I am somewhat suspicious of the flu. Her chest x-ray is clear with no sign of pneumonia, and she's not hypoxic. Her urinalysis is negative for sign of infection. She is having some upper abdominal/lower chest pain and she does have a history of aneurysms, and so I am going to  image with CT.   ----------------------------------------- 2:43 PM on 10/27/2015 -----------------------------------------  Flu test negative. CT of the chest and abdomen negative for intra-abdominal or vascular emergency, however did visualize a right lower lobe pneumonia. This is likely the source of her infection/sepsis. No hypotension however, and no elevated lactate. Patient case was discussed with hospitalist for admission.  Patient / Family / Caregiver informed of clinical course, medical decision-making process, and agree with plan.    ___________________________________________   FINAL CLINICAL IMPRESSION(S) / ED DIAGNOSES   Final diagnoses:  Sepsis, due to unspecified organism [facility])  Chest pain  Right lower lobe pneumonia       Lisa Roca, MD 10/27/15 1514

## 2015-10-28 LAB — BASIC METABOLIC PANEL
Anion gap: 6 (ref 5–15)
BUN: 13 mg/dL (ref 6–20)
CHLORIDE: 109 mmol/L (ref 101–111)
CO2: 25 mmol/L (ref 22–32)
Calcium: 8.1 mg/dL — ABNORMAL LOW (ref 8.9–10.3)
Creatinine, Ser: 0.69 mg/dL (ref 0.44–1.00)
Glucose, Bld: 131 mg/dL — ABNORMAL HIGH (ref 65–99)
POTASSIUM: 3.7 mmol/L (ref 3.5–5.1)
SODIUM: 140 mmol/L (ref 135–145)

## 2015-10-28 LAB — CBC
HEMATOCRIT: 36.9 % (ref 35.0–47.0)
Hemoglobin: 12.1 g/dL (ref 12.0–16.0)
MCH: 30.3 pg (ref 26.0–34.0)
MCHC: 32.8 g/dL (ref 32.0–36.0)
MCV: 92.6 fL (ref 80.0–100.0)
PLATELETS: 215 10*3/uL (ref 150–440)
RBC: 3.99 MIL/uL (ref 3.80–5.20)
RDW: 13.2 % (ref 11.5–14.5)
WBC: 24 10*3/uL — AB (ref 3.6–11.0)

## 2015-10-28 LAB — URINE CULTURE

## 2015-10-28 NOTE — Plan of Care (Signed)
Problem: Activity: Goal: Ability to tolerate increased activity will improve Outcome: Progressing Ambulation with 1 person assist. Dyspnea with Exertion.  Educated importance of taking rest periods. Oxygen at night and PRN.  Problem: Education: Goal: Knowledge of disease or condition will improve Outcome: Progressing Has received General Education Handout. Oriented to Oncology unit.     Instructed and Demonstrated how to use phone to call RN and CNA for Assistance.  Family remains at bedside.    Goal: Knowledge of the prescribed therapeutic regimen will improve Outcome: Progressing Remains on IV Antibiotics. Explained reason for Antibiotics. Verbalized understands. Lovenox given as ordered.  Explained reason for Lovenox. Verbalized understands.    Goal: Ability to identify and alter actions that are detrimental to health will improve Outcome: Progressing Encouraged to Ambulate with assist. Compliant. Remains on IV Antibiotics. Explained reason for Antibiotics. Verbalized understands. Lovenox given as ordered.  Explained reason for Lovenox. Verbalized understands.      Problem: Health Behavior/Discharge Planning: Goal: Ability to manage health-related needs will improve Home Alone. Family at Bedside. Supportive family. Plan to discharge home after hospitalization.  Problem: Coping: Goal: Verbalizations of decreased anxiety will increase Outcome: Progressing No complaints of anxiety during shift.  Problem: Physical Regulation: Goal: Ability to maintain a body temperature in the normal range will improve Outcome: Progressing Vital Signs Stable. Afebrile. Elevated WBC's. Remains on IV Antibiotics.  Problem: Respiratory: Goal: Respiratory status will improve Outcome: Progressing Oxygen saturations stable on room air. Chronic oxygen at night on 2L [place]. Oxygen PRN. Budesonide (PULMICORT) nebulizer solution 0.5 mg 2 (two) times daily. Spiriva Inhaler  daily. Prednisone tablet 5 mg daily. Goal: Pain level will decrease Outcome: Progressing Denies pain during shift.

## 2015-10-28 NOTE — Progress Notes (Signed)
[facility] [place] NAME: Danielle Williamson    MR#:  [id number]  DATE OF BIRTH:  November 14, 1931  SUBJECTIVE:   Patient here due to fever and sepsis due to pneumonia. Feels a lot better today. Still has some body aches but improved. Afebrile overnight.  REVIEW OF SYSTEMS:    Review of Systems  Constitutional: Negative for fever and chills.  HENT: Negative for congestion and tinnitus.   Eyes: Negative for blurred vision and double vision.  Respiratory: Positive for cough. Negative for shortness of breath and wheezing.   Cardiovascular: Negative for chest pain, orthopnea and PND.  Gastrointestinal: Negative for nausea, vomiting, abdominal pain and diarrhea.  Genitourinary: Negative for dysuria and hematuria.  Neurological: Positive for weakness. Negative for dizziness, sensory change and focal weakness.  All other systems reviewed and are negative.   Nutrition: Heart healthy Tolerating Diet: yes Tolerating PT:  Eval noted.    DRUG ALLERGIES:   Allergies  Allergen Reactions  . Epinephrine   . Iodine     Hives per patient (10/27/15)   . Lopid [Gemfibrozil] Other (See Comments)    Reaction: Muscle pain  . Lovastatin Other (See Comments)    Reaction: Muscle pain  . Neosporin [Neomycin-Bacitracin Zn-Polymyx]   . Polysporin [Bacitracin-Polymyxin B]   . Vesicare [Solifenacin] Other (See Comments)    Reaction: Palpations  . Penicillin V Potassium Rash  . Penicillins Rash    VITALS:  Blood pressure 125/55, pulse 87, temperature 97.9 F (36.6 C), temperature source Oral, resp. rate 19, height 5\' 5"  (1.651 m), weight 62.642 kg (138 lb 1.6 oz), SpO2 94 %.  PHYSICAL EXAMINATION:   Physical Exam  GENERAL:  82 y.o.-year-old sitting up in bed in no acute distress.  EYES: Pupils equal, round, reactive to light and accommodation. No scleral icterus. Extraocular muscles intact.  HEENT: Head atraumatic, normocephalic. Oropharynx and  nasopharynx clear.  NECK:  Supple, no jugular venous distention. No thyroid enlargement, no tenderness.  LUNGS: Normal breath sounds bilaterally, no wheezing, rales, rhonchi. No use of accessory muscles of respiration.  CARDIOVASCULAR: S1, S2 normal. II/VI SEM at LSB, No rubs, or gallops.  ABDOMEN: Soft, nontender, nondistended. Bowel sounds present. No organomegaly or mass.  EXTREMITIES: No cyanosis, clubbing or edema b/l.    NEUROLOGIC: Cranial nerves II through XII are intact. No focal Motor or sensory deficits b/l.   PSYCHIATRIC: The patient is alert and oriented x 3.  SKIN: No obvious rash, lesion, or ulcer.    LABORATORY PANEL:   CBC  Recent Labs Lab 10/28/15 0505  WBC 24.0*  HGB 12.1  HCT 36.9  PLT 215   ------------------------------------------------------------------------------------------------------------------  Chemistries   Recent Labs Lab 10/27/15 1102 10/28/15 0505  NA 137 140  K 3.9 3.7  CL 100* 109  CO2 27 25  GLUCOSE 93 131*  BUN 16 13  CREATININE 0.87 0.69  CALCIUM 8.8* 8.1*  AST 18  --   ALT 16  --   ALKPHOS 48  --   BILITOT 1.4*  --    ------------------------------------------------------------------------------------------------------------------  Cardiac Enzymes No results for input(s): TROPONINI in the last 168 hours. ------------------------------------------------------------------------------------------------------------------  RADIOLOGY:  Dg Chest 2 View  10/27/2015  CLINICAL DATA:  Code sepsis. EXAM: CHEST  2 VIEW COMPARISON:  Chest CT 01/22/2014 FINDINGS: There is no edema, consolidation, effusion, or pneumothorax. Normal heart size. Descending thoracic aorta bulge in the lateral projection correlating with aneurysm seen on 01/22/2014. Size comparison is of limited utility  given magnification and partial visualization of aortic margins. IMPRESSION: 1. No explanation for sepsis. 2. Known descending aortic aneurysm. Electronically  Signed   By: Monte Fantasia M.D.   On: 10/27/2015 11:51   Ct Angio Chest Aorta W/cm &/or Wo/cm  10/27/2015  CLINICAL DATA:  Chest pain. Fever and generalized body aches for 2 days. EXAM: CT ANGIOGRAPHY CHEST, ABDOMEN AND PELVIS TECHNIQUE: Multidetector CT imaging through the chest, abdomen and pelvis was performed using the standard protocol during bolus administration of intravenous contrast. Multiplanar reconstructed images and MIPs were obtained and reviewed to evaluate the vascular anatomy. CONTRAST:  111mL OMNIPAQUE IOHEXOL 350 MG/ML SOLN COMPARISON:  Chest CT 01/22/2014 FINDINGS: CTA CHEST FINDINGS THORACIC INLET/BODY WALL: No acute abnormality. MEDIASTINUM: Normal heart size and no pericardial effusion. Mild anterior pericardial thickening is stable. Moderate aortic valve calcifications/ sclerosis. Extensive atherosclerosis, including the coronary arteries. A fusiform distal thoracic aorta aneurysm with mural thrombus measures 39 mm maximal outer wall diameter, increased by 2 mm compared to prior. There is no evidence of acute aortic syndrome including rupture, dissection, or intramural hematoma. No plaque ulceration. No evidence of pulmonary embolism. LUNG WINDOWS: Dense consolidation in the superior and medial right lower lobe. There is broad pleural contact which could explain history of chest pain. Calcified granulomas in the right lung. No suspicious pulmonary nodule. No pleural effusion or cavitation. OSSEOUS: No acute fracture.  No suspicious lytic or blastic lesions. Review of the MIP images confirms the above findings. CTA ABDOMEN AND PELVIS FINDINGS Hepatobiliary: Heterogeneous liver density which is likely from contrast timing. There are subtle hypervascular areas, pattern also seen on comparison study and likely small shunts.Cholecystectomy. Pancreas: Coarse calcifications in the uncinate, nonspecific. No duct enlargement or active inflammation. Spleen: Unremarkable. Adrenals/Urinary Tract:  Negative adrenals. No hydronephrosis or stone. Unremarkable bladder. Reproductive:Hysterectomy with negative adnexa Stomach/Bowel:  No obstruction. No appendicitis. Vascular/Lymphatic: Right hepatic artery is replaced to the SMA. Otherwise, abdominal aortic branching is standard. Diffuse atheromatous wall calcification and thickening. No aneurysm or major branch occlusion. No dissection or inflammatory wall thickening. No mass or adenopathy. Peritoneal: No ascites or pneumoperitoneum. Musculoskeletal: No acute abnormalities. Review of the MIP images confirms the above findings. IMPRESSION: 1. Right lower lobe pneumonia. The opacity is subtle by chest x-ray but visible laterally. Followup PA and lateral chest X-ray is recommended in 3-4 weeks following trial of antibiotic therapy to ensure resolution. 2. No acute aortic finding. 3. Fusiform descending thoracic aortic aneurysm measures 39 mm in diameter, 2 mm growth since April 2015. Electronically Signed   By: Monte Fantasia M.D.   On: 10/27/2015 14:19   Ct Cta Abd/pel W/cm &/or W/o Cm  10/27/2015  CLINICAL DATA:  Chest pain. Fever and generalized body aches for 2 days. EXAM: CT ANGIOGRAPHY CHEST, ABDOMEN AND PELVIS TECHNIQUE: Multidetector CT imaging through the chest, abdomen and pelvis was performed using the standard protocol during bolus administration of intravenous contrast. Multiplanar reconstructed images and MIPs were obtained and reviewed to evaluate the vascular anatomy. CONTRAST:  175mL OMNIPAQUE IOHEXOL 350 MG/ML SOLN COMPARISON:  Chest CT 01/22/2014 FINDINGS: CTA CHEST FINDINGS THORACIC INLET/BODY WALL: No acute abnormality. MEDIASTINUM: Normal heart size and no pericardial effusion. Mild anterior pericardial thickening is stable. Moderate aortic valve calcifications/ sclerosis. Extensive atherosclerosis, including the coronary arteries. A fusiform distal thoracic aorta aneurysm with mural thrombus measures 39 mm maximal outer wall diameter,  increased by 2 mm compared to prior. There is no evidence of acute aortic syndrome including rupture, dissection, or intramural hematoma.  No plaque ulceration. No evidence of pulmonary embolism. LUNG WINDOWS: Dense consolidation in the superior and medial right lower lobe. There is broad pleural contact which could explain history of chest pain. Calcified granulomas in the right lung. No suspicious pulmonary nodule. No pleural effusion or cavitation. OSSEOUS: No acute fracture.  No suspicious lytic or blastic lesions. Review of the MIP images confirms the above findings. CTA ABDOMEN AND PELVIS FINDINGS Hepatobiliary: Heterogeneous liver density which is likely from contrast timing. There are subtle hypervascular areas, pattern also seen on comparison study and likely small shunts.Cholecystectomy. Pancreas: Coarse calcifications in the uncinate, nonspecific. No duct enlargement or active inflammation. Spleen: Unremarkable. Adrenals/Urinary Tract: Negative adrenals. No hydronephrosis or stone. Unremarkable bladder. Reproductive:Hysterectomy with negative adnexa Stomach/Bowel:  No obstruction. No appendicitis. Vascular/Lymphatic: Right hepatic artery is replaced to the SMA. Otherwise, abdominal aortic branching is standard. Diffuse atheromatous wall calcification and thickening. No aneurysm or major branch occlusion. No dissection or inflammatory wall thickening. No mass or adenopathy. Peritoneal: No ascites or pneumoperitoneum. Musculoskeletal: No acute abnormalities. Review of the MIP images confirms the above findings. IMPRESSION: 1. Right lower lobe pneumonia. The opacity is subtle by chest x-ray but visible laterally. Followup PA and lateral chest X-ray is recommended in 3-4 weeks following trial of antibiotic therapy to ensure resolution. 2. No acute aortic finding. 3. Fusiform descending thoracic aortic aneurysm measures 39 mm in diameter, 2 mm growth since April 2015. Electronically Signed   By: Monte Fantasia  M.D.   On: 10/27/2015 14:19     ASSESSMENT AND PLAN:   Devona Sneider is a 82 y.o. female with a known history of COPD on nocturnal home oxygen, hypertension, asthma, thoracic aneurysm, aortic stenosis, diastolic CHF, hypertension and hyperlipidemia presents to the hospital secondary to worsening fevers, chills and difficulty breathing for 2 days now.  #1 sepsis-secondary to community-acquired pneumonia. -CT chest on admission showed right middle and lower lobe pneumonias. That would explain her right-sided pleuritic chest pain. Improved since yesterday.  - cont. IV Ceftriaxone, Zithromax and follow cultures which are (-) so far.   #2 pneumonia-community acquired.  -Continue IV ceftriaxone, Zithromax. Follow cultures.  #3 COPD-no acute exacerbation. Continue duo nebs as needed, Spiriva and Pulmicort nebs for now -On oxygen at bedtime  #3 hypertension-continue Norvasc  #4 hyperlipidemia-on statin  #5 hypothyroidism-on Synthroid  #6 anxiety-continue Xanax  #7 leukocytosis-due to pneumonia and we'll follow white cell count.  All the records are reviewed and case discussed with Care Management/Social Workerr. Management plans discussed with the patient, family and they are in agreement.  CODE STATUS: Full  DVT Prophylaxis: Lovenox  TOTAL TIME TAKING CARE OF THIS PATIENT: 30 minutes.   POSSIBLE D/C IN 1-2 DAYS, DEPENDING ON CLINICAL CONDITION.   Henreitta Leber M.D on 10/28/2015 at 3:06 PM  Between 7am to 6pm - Pager - [phone]  After 6pm go to [email or web address] - password [email or web address] [facility]  Office  [phone]  CC: Primary care physician; Madelyn Brunner, MD

## 2015-10-28 NOTE — Evaluation (Signed)
Physical Therapy Evaluation Patient Details Name: Danielle Williamson MRN: [id number] DOB: 1932/09/28 Today's Date: 10/28/2015   History of Present Illness  Pt is an 82 y.o. female presenting to hospital with fever, generalized body aches, and difficulty breathing x2 days.  Pt admitted with sepsis secondary to community acquired PNA.  Of note, pt with known descending thoracic aortic aneurysm (43mm).  PMH also includes COPD, on nocturnal home O2.  Clinical Impression  Prior to admission, pt was independent without AD.  Pt's grandson lives with her and pt stays on main level of home and has stairs to enter.  Currently pt is CGA with transfers and ambulation 120 feet.  Pt educated on walking with staff in hallway during hospital stay to prevent deconditioning.  Pt would benefit from skilled PT to address noted impairments and functional limitations.  Recommend pt discharge to home with support of family (pt reports having a lot of people who check in on her if needed) when medically appropriate.     Follow Up Recommendations No PT follow up    Equipment Recommendations  None recommended by PT    Recommendations for Other Services       Precautions / Restrictions Precautions Precautions: Fall Restrictions Weight Bearing Restrictions: No      Mobility  Bed Mobility               General bed mobility comments: Not assessed d/t pt sitting on edge of bed upon arrival  Transfers Overall transfer level: Needs assistance Equipment used: None Transfers: Sit to/from Stand Sit to Stand: Min guard         General transfer comment: steady without loss of balance  Ambulation/Gait Ambulation/Gait assistance: Min guard Ambulation Distance (Feet): 120 Feet Assistive device: None   Gait velocity: initially decreased then at normal speed   General Gait Details: initially decreased B step length/foot clearance/heelstrike and holding onto railing in hallway for support but with vc's for  gait technique pt with improved step length/foot clearance/heelstrike and steady without any UE support; limited distance d/t fatigue  Stairs            Wheelchair Mobility    Modified Rankin (Stroke Patients Only)       Balance Overall balance assessment: Needs assistance Sitting-balance support: Bilateral upper extremity supported;Feet supported Sitting balance-Leahy Scale: Normal     Standing balance support: No upper extremity supported Standing balance-Leahy Scale: Good Standing balance comment: static standing                             Pertinent Vitals/Pain Pain Assessment: No/denies pain  See flow sheet for details.    Home Living Family/patient expects to be discharged to:: Private residence Living Arrangements: Other relatives (Pt's grandson (pt home alone 40% of the time)) Available Help at Discharge: Family;Friend(s) Type of Home: House Home Access: Stairs to enter   [employer] of Steps: 4 with R grab bar Home Layout: Two level;Able to live on main level with bedroom/bathroom Home Equipment: Grab bars - tub/shower;Grab bars - toilet (may have a walker in storage from family member)      Prior Function Level of Independence: Independent               Hand Dominance        Extremity/Trunk Assessment   Upper Extremity Assessment: Overall WFL for tasks assessed           Lower Extremity Assessment: Overall  WFL for tasks assessed      Cervical / Trunk Assessment: Normal  Communication   Communication: No difficulties  Cognition Arousal/Alertness: Awake/alert Behavior During Therapy: WFL for tasks assessed/performed Overall Cognitive Status: Within Functional Limits for tasks assessed                      General Comments   Nursing cleared pt for participation in physical therapy.  Pt agreeable to PT session.    Exercises        Assessment/Plan    PT Assessment Patient needs continued PT  services  PT Diagnosis Difficulty walking   PT Problem List Decreased activity tolerance;Decreased balance;Decreased mobility  PT Treatment Interventions DME instruction;Gait training;Stair training;Functional mobility training;Therapeutic activities;Therapeutic exercise;Balance training;Patient/family education   PT Goals (Current goals can be found in the Care Plan section) Acute Rehab PT Goals Patient Stated Goal: to go home PT Goal Formulation: With patient Time For Goal Achievement: 11/11/15 Potential to Achieve Goals: Good    Frequency Min 2X/week   Barriers to discharge        Co-evaluation               End of Session Equipment Utilized During Treatment: Gait belt Activity Tolerance: Patient limited by fatigue Patient left: with call bell/phone within reach;with bed alarm set (sitting on edge of bed to eat breakfast (pt declined chair)) Nurse Communication: Mobility status         Time: [id number] PT Time Calculation (min) (ACUTE ONLY): 17 min   Charges:   PT Evaluation $PT Eval Low Complexity: 1 Procedure     PT G CodesLeitha Bleak Nov 09, 2015, 9:13 AM Leitha Bleak, [place]

## 2015-10-28 NOTE — Plan of Care (Signed)
Problem: Activity: Goal: Ability to tolerate increased activity will improve Outcome: Progressing SOB w/ exertion. Educated importance of taking rest periods.  Problem: Health Behavior/Discharge Planning: Goal: Ability to manage health-related needs will improve Outcome: Progressing Pt from home alone. Daughter at bedside through the night, family very supportive & involved.   Problem: Physical Regulation: Goal: Diagnostic test results will improve Outcome: Progressing VSS, afebrile. WBC 19.1, will be rechecked this AM.  Problem: Respiratory: Goal: Respiratory status will improve Outcome: Progressing Oxygen saturations stable on room air, chronic oxygen at night on 2L.  Goal: Pain level will decrease Outcome: Progressing No c/o pain, resting comfortably through the night

## 2015-10-29 ENCOUNTER — Inpatient Hospital Stay: Payer: Medicare Other

## 2015-10-29 DIAGNOSIS — R531 Weakness: Secondary | ICD-10-CM

## 2015-10-29 DIAGNOSIS — J189 Pneumonia, unspecified organism: Secondary | ICD-10-CM

## 2015-10-29 DIAGNOSIS — D72829 Elevated white blood cell count, unspecified: Secondary | ICD-10-CM

## 2015-10-29 DIAGNOSIS — R0781 Pleurodynia: Secondary | ICD-10-CM

## 2015-10-29 DIAGNOSIS — R0600 Dyspnea, unspecified: Secondary | ICD-10-CM

## 2015-10-29 DIAGNOSIS — J181 Lobar pneumonia, unspecified organism: Secondary | ICD-10-CM

## 2015-10-29 LAB — CBC
HEMATOCRIT: 35.7 % (ref 35.0–47.0)
Hemoglobin: 11.8 g/dL — ABNORMAL LOW (ref 12.0–16.0)
MCH: 30.4 pg (ref 26.0–34.0)
MCHC: 33.2 g/dL (ref 32.0–36.0)
MCV: 91.4 fL (ref 80.0–100.0)
PLATELETS: 239 10*3/uL (ref 150–440)
RBC: 3.9 MIL/uL (ref 3.80–5.20)
RDW: 12.9 % (ref 11.5–14.5)
WBC: 17.6 10*3/uL — ABNORMAL HIGH (ref 3.6–11.0)

## 2015-10-29 LAB — MAGNESIUM: Magnesium: 2.2 mg/dL (ref 1.7–2.4)

## 2015-10-29 MED ORDER — GUAIFENESIN ER 600 MG PO TB12: 600.0000 mg | ORAL_TABLET | Freq: Two times a day (BID) | ORAL | Status: AC

## 2015-10-29 MED ORDER — LEVOFLOXACIN 750 MG PO TABS: 750.0000 mg | ORAL_TABLET | Freq: Every day | ORAL | Status: DC

## 2015-10-29 MED ORDER — BISACODYL 10 MG RE SUPP: 10.0000 mg | Freq: Every day | RECTAL | Status: AC

## 2015-10-29 MED ORDER — SALINE SPRAY 0.65 % NA SOLN
1.0000 | NASAL | Status: DC | PRN
  Administered 2015-10-29: 12:00:00 1 via NASAL
  Filled 2015-10-29 (×2): qty 44

## 2015-10-29 MED ORDER — DOCUSATE SODIUM 100 MG PO CAPS: 100.0000 mg | ORAL_CAPSULE | Freq: Two times a day (BID) | ORAL | Status: AC

## 2015-10-29 MED ORDER — HYDROCOD POLST-CPM POLST ER 10-8 MG/5ML PO SUER: 5.0000 mL | Freq: Two times a day (BID) | ORAL | Status: AC | PRN

## 2015-10-29 MED ORDER — BISACODYL 10 MG RE SUPP
10.0000 mg | Freq: Every day | RECTAL | Status: DC
  Filled 2015-10-29: qty 1

## 2015-10-29 NOTE — Discharge Instructions (Signed)
Community-Acquired Pneumonia, Adult Pneumonia is an infection of the lungs. One type of pneumonia can happen while a person is in a hospital. A different type can happen when a person is not in a hospital (community-acquired pneumonia). It is easy for this kind to spread from person to person. It can spread to you if you breathe near an infected person who coughs or sneezes. Some symptoms include:  A dry cough.  A wet (productive) cough.  Fever.  Sweating.  Chest pain. HOME CARE  Take over-the-counter and prescription medicines only as told by your doctor.  Only take cough medicine if you are losing sleep.  If you were prescribed an antibiotic medicine, take it as told by your doctor. Do not stop taking the antibiotic even if you start to feel better.  Sleep with your head and neck raised (elevated). You can do this by putting a few pillows under your head, or you can sleep in a recliner.  Do not use tobacco products. These include cigarettes, chewing tobacco, and e-cigarettes. If you need help quitting, ask your doctor.  Drink enough water to keep your pee (urine) clear or pale yellow. A shot (vaccine) can help prevent pneumonia. Shots are often suggested for:  People older than 81 years of age.  People older than 80 years of age:  Who are having cancer treatment.  Who have long-term (chronic) lung disease.  Who have problems with their body's defense system (immune system). You may also prevent pneumonia if you take these actions:  Get the flu (influenza) shot every year.  Go to the dentist as often as told.  Wash your hands often. If soap and water are not available, use hand sanitizer. GET HELP IF:  You have a fever.  You lose sleep because your cough medicine does not help. GET HELP RIGHT AWAY IF:  You are short of breath and it gets worse.  You have more chest pain.  Your sickness gets worse. This is very serious if:  You are an older adult.  Your  body's defense system is weak.  You cough up blood.   This information is not intended to replace advice given to you by your health care provider. Make sure you discuss any questions you have with your health care provider.   Document Released: 03/26/2008 Document Revised: 06/29/2015 Document Reviewed: 02/02/2015 Elsevier Interactive Patient Education 2016 [employer]  Sepsis, Adult Sepsis is a serious infection of your blood or tissues that affects your whole body. The infection that causes sepsis may be bacterial, viral, fungal, or parasitic. Sepsis may be life threatening. Sepsis can cause your blood pressure to drop. This may result in shock. Shock causes your central nervous system and your organs to stop working correctly.  RISK FACTORS Sepsis can happen in anyone, but it is more likely to happen in people who have weakened immune systems. SIGNS AND SYMPTOMS  Symptoms of sepsis can include:  Fever or low body temperature (hypothermia).  Rapid breathing (hyperventilation).  Chills.  Rapid heartbeat (tachycardia).  Confusion or light-headedness.  Trouble breathing.  Urinating much less than usual.  Cool, clammy skin or red, flushed skin.  Other problems with the heart, kidneys, or brain. DIAGNOSIS  Your health care provider will likely do tests to look for an infection, to see if the infection has spread to your blood, and to see how serious your condition is. Tests can include:  Blood tests, including cultures of your blood.  Cultures of other fluids from  your body, such as:  Urine.  Pus from wounds.  Mucus coughed up from your lungs.  Urine tests other than cultures.  X-ray exams or other imaging tests. TREATMENT  Treatment will begin with elimination of the source of infection. If your sepsis is likely caused by a bacterial or fungal infection, you will be given antibiotic or antifungal medicines. You may also receive:  Oxygen.  Fluids through an IV  tube.  Medicines to increase your blood pressure.  A machine to clean your blood (dialysis) if your kidneys fail.  A machine to help you breathe if your lungs fail. SEEK IMMEDIATE MEDICAL CARE IF: You get an infection or develop any of the signs and symptoms of sepsis after surgery or a hospitalization.   This information is not intended to replace advice given to you by your health care provider. Make sure you discuss any questions you have with your health care provider.   Document Released: 07/07/2003 Document Revised: 02/22/2015 Document Reviewed: 06/15/2013 Elsevier Interactive Patient Education [employer].

## 2015-10-29 NOTE — Clinical Documentation Improvement (Signed)
Internal Medicine  Abnormal Lab/Test Results:  Hgb drop to 8.1 documented on 1/6; Transfused 1 unit PRBC's. Please provide diagnosis associated with abnormal lab value and treatment provided and document findings in next progress note; NOT in BPA drop down box. Thanks!  Possible Clinical Conditions associated with below indicators   Other Condition  Cannot Clinically Determine  Supporting Information:  Hgb dropped to 8.1; transfused  Repeat draw Hgb = 11.8  Please exercise your independent, professional judgment when responding. A specific answer is not anticipated or expected.  Thank You,  Zoila Shutter RN, BSN, [place] [phone]; Cell: [phone]

## 2015-10-29 NOTE — Discharge Summary (Signed)
[facility] NAME: Danielle Williamson    MR#:  [id number]  DATE OF BIRTH:  [birth year]  DATE OF ADMISSION:  10/27/2015 ADMITTING PHYSICIAN: Gladstone Lighter, MD  DATE OF DISCHARGE: No discharge date for patient encounter.  PRIMARY CARE PHYSICIAN: Madelyn Brunner, MD     ADMISSION DIAGNOSIS:  Right lower lobe pneumonia [J18.9] Chest pain [R07.9] Sepsis, due to unspecified organism ([place]) [A41.9]  DISCHARGE DIAGNOSIS:  Principal Problem:   Sepsis ([place]) Active Problems:   Right lower lobe pneumonia   Pleuritic chest pain   Dyspnea   Leukocytosis   General weakness   SECONDARY DIAGNOSIS:   Past Medical History  Diagnosis Date  . COPD (chronic obstructive pulmonary disease) (HCC)     on nocturnal o2  . Asthma   . Hypertension   . UTI (lower urinary tract infection)   . Aneurysm ([place])     thoracic aortic aneurysm  . Pulmonary emphysema ([place])   . Aortic insufficiency   . Aneurysm, thoracic aortic (HCC) Abdominal Aneurysm  . CAD (coronary artery disease)     no h/o stents or CABG  . Hyperlipidemia   . Thyroid disease     hypo  . Constipation   . History of adenomatous polyp of colon   . Skin cancer   . History of uterine cancer   . Diastolic CHF ([place])   . Allergy   . Anxiety   . Arthritis   . Cataract Bilateral Eyes    .pro HOSPITAL COURSE:   Patient is 82 year old Caucasian female with past medical history significant for history of COPD, on oxygen at home at nighttime, hypertension, asthma, aortic stenosis, diastolic CHF, hypertension and hyperlipidemia who presents to the hospital with fever, chills and shortness of breath as well as cough and weakness. On arrival to emergency room, patient's labs revealed leukocytosis up to 24,000 . Chest x-ray was unremarkable. CT angiogram at that point was performed which revealed right lower lobe pneumonia. Follow-up PA and lateral chest x-rays were recommended in  about 4 weeks following trial of antibiotic therapy to ensure resolution. 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 Patient was initiated on broad-spectrum antibiotic therapy with Rocephin and Zithromax and her condition improved. She was felt to be stable to be discharged home today on the 10/29/2015. Discussion by problem  #1 sepsis-secondary to community-acquired pneumonia. -CT chest on admission showed right middle and lower lobe pneumonias. That would explain her right-sided pleuritic chest pain. Improved .  - She was on IV Ceftriaxone, Zithromax while in the hospital , however, changed to levofloxacin upon discharge home , patient is to continue antibiotic for 5 more days , blood and urine cultures were negative so far and patient's influenza test was negative . White blood cell count is improving  #2 pneumonia-community acquired.  -Continue oral levofloxacin for 5 more days. Follow cultures.  #3 COPD-no acute exacerbation. Continue duo nebs as needed, Spiriva at home -On oxygen at bedtime  #3 hypertension-continue Norvasc  #4 hyperlipidemia-on statin  #5 hypothyroidism-on Synthroid  #6 anxiety-continue Xanax  #7 leukocytosis-due to pneumonia and we'll follow white cell count as outpatient   DISCHARGE CONDITIONS:   Stable  CONSULTS OBTAINED:     DRUG ALLERGIES:   Allergies  Allergen Reactions  . Epinephrine   . Iodine     Hives per patient (10/27/15)   . Lopid [Gemfibrozil] Other (See  Comments)    Reaction: Muscle pain  . Lovastatin Other (See Comments)    Reaction: Muscle pain  . Neosporin [Neomycin-Bacitracin Zn-Polymyx]   . Polysporin [Bacitracin-Polymyxin B]   . Vesicare [Solifenacin] Other (See Comments)    Reaction: Palpations  . Penicillin V Potassium Rash  . Penicillins Rash    DISCHARGE MEDICATIONS:   Current Discharge Medication List    START taking these medications   Details   chlorpheniramine-HYDROcodone (TUSSIONEX) 10-8 MG/5ML SUER Take 5 mLs by mouth every 12 (twelve) hours as needed for cough. Qty: 140 mL, Refills: 0    docusate sodium (COLACE) 100 MG capsule Take 1 capsule (100 mg total) by mouth 2 (two) times daily. Qty: 10 capsule, Refills: 0    guaiFENesin (MUCINEX) 600 MG 12 hr tablet Take 1 tablet (600 mg total) by mouth 2 (two) times daily. Qty: 20 tablet, Refills: 2    levofloxacin (LEVAQUIN) 750 MG tablet Take 1 tablet (750 mg total) by mouth daily. Qty: 5 tablet, Refills: 0      CONTINUE these medications which have NOT CHANGED   Details  albuterol (PROVENTIL HFA;VENTOLIN HFA) 108 (90 Base) MCG/ACT inhaler Inhale 2 puffs into the lungs every 6 (six) hours as needed for wheezing.    albuterol (PROVENTIL) (2.5 MG/3ML) 0.083% nebulizer solution Take 2.5 mg by nebulization every 6 (six) hours as needed for wheezing.    ALPRAZolam (XANAX) 0.25 MG tablet Take 0.25 mg by mouth 3 (three) times daily as needed for anxiety.    amLODipine (NORVASC) 5 MG tablet Take 1 tablet by mouth daily. Refills: 4    atorvastatin (LIPITOR) 20 MG tablet Take 1 tablet by mouth daily. Refills: 2    Cholecalciferol (VITAMIN D3) 5000 units TABS Take 1 tablet by mouth daily.    fluticasone (FLOVENT HFA) 220 MCG/ACT inhaler Inhale 1 puff into the lungs 2 (two) times daily.    furosemide (LASIX) 20 MG tablet Take 1 tablet by mouth daily. Refills: 5    hydrocortisone 2.5 % cream Apply 1 application topically 2 (two) times daily.    ketoconazole (NIZORAL) 2 % shampoo Apply 1 application topically 2 (two) times a week.    levothyroxine (SYNTHROID, LEVOTHROID) 75 MCG tablet Take 1 tablet by mouth daily. Refills: 10    mupirocin ointment (BACTROBAN) 2 % Place 1 application into the nose 2 (two) times daily.    potassium chloride SA (K-DUR,KLOR-CON) 20 MEQ tablet Take 1 tablet by mouth daily. Refills: 5    predniSONE (DELTASONE) 5 MG tablet Take 1 tablet by mouth  daily. Refills: 1    SPIRIVA RESPIMAT 2.5 MCG/ACT AERS Take 2 puffs by mouth daily. Refills: 10         DISCHARGE INSTRUCTIONS:    Patient is to follow-up with primary care physician in the next 1 week  If you experience worsening of your admission symptoms, develop shortness of breath, life threatening emergency, suicidal or homicidal thoughts you must seek medical attention immediately by calling 911 or calling your MD immediately  if symptoms less severe.  You Must read complete instructions/literature along with all the possible adverse reactions/side effects for all the Medicines you take and that have been prescribed to you. Take any new Medicines after you have completely understood and accept all the possible adverse reactions/side effects.   Please note  You were cared for by a hospitalist during your hospital stay. If you have any questions about your discharge medications or the care you received while you were in  the hospital after you are discharged, you can call the unit and asked to speak with the hospitalist on call if the hospitalist that took care of you is not available. Once you are discharged, your primary care physician will handle any further medical issues. Please note that NO REFILLS for any discharge medications will be authorized once you are discharged, as it is imperative that you return to your primary care physician (or establish a relationship with a primary care physician if you do not have one) for your aftercare needs so that they can reassess your need for medications and monitor your lab values.    Today   CHIEF COMPLAINT:   Chief Complaint  Patient presents with  . Fever  . Generalized Body Aches    HISTORY OF PRESENT ILLNESS:  Danielle Williamson  is a 82 y.o. female with a known history of COPD, on oxygen at home at nighttime, hypertension, asthma, aortic stenosis, diastolic CHF, hypertension and hyperlipidemia who presents to the hospital with  fever, chills and shortness of breath as well as cough and weakness. On arrival to emergency room, patient's labs revealed leukocytosis up to 24,000 . Chest x-ray was unremarkable. CT angiogram at that point was performed which revealed right lower lobe pneumonia. Follow-up PA and lateral chest x-rays were recommended in about 4 weeks following trial of antibiotic therapy to ensure resolution. 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 Patient was initiated on broad-spectrum antibiotic therapy with Rocephin and Zithromax and her condition improved. She was felt to be stable to be discharged home today on the 10/29/2015. Discussion by problem  #1 sepsis-secondary to community-acquired pneumonia. -CT chest on admission showed right middle and lower lobe pneumonias. That would explain her right-sided pleuritic chest pain. Improved .  - She was on IV Ceftriaxone, Zithromax while in the hospital , however, changed to levofloxacin upon discharge home , patient is to continue antibiotic for 5 more days , blood and urine cultures were negative so far and patient's influenza test was negative . White blood cell count is improving  #2 pneumonia-community acquired.  -Continue oral levofloxacin for 5 more days. Follow cultures.  #3 COPD-no acute exacerbation. Continue duo nebs as needed, Spiriva at home -On oxygen at bedtime  #3 hypertension-continue Norvasc  #4 hyperlipidemia-on statin  #5 hypothyroidism-on Synthroid  #6 anxiety-continue Xanax  #7 leukocytosis-due to pneumonia and we'll follow white cell count as outpatient    VITAL SIGNS:  Blood pressure 125/51, pulse 106, temperature 99.9 F (37.7 C), temperature source Oral, resp. rate 18, height 5\' 5"  (1.651 m), weight 62.642 kg (138 lb 1.6 oz), SpO2 96 %.  I/O:   Intake/Output Summary (Last 24 hours) at 10/29/15 1202 Last data filed at 10/29/15 0800  Gross per 24 hour  Intake   892.5 ml  Output    600 ml  Net  292.5 ml    PHYSICAL EXAMINATION:  GENERAL:  82 y.o.-year-old patient lying in the bed with no acute distress.  EYES: Pupils equal, round, reactive to light and accommodation. No scleral icterus. Extraocular muscles intact.  HEENT: Head atraumatic, normocephalic. Oropharynx and nasopharynx clear.  NECK:  Supple, no jugular venous distention. No thyroid enlargement, no tenderness.  LUNGS: Normal breath sounds bilaterally, no wheezing, rales,rhonchi or crepitation. No use of accessory muscles of respiration. Few crackles on the right side of the base posteriorly during auscultation  CARDIOVASCULAR: S1, S2 normal. No murmurs, rubs, or gallops.  ABDOMEN: Soft, non-tender, non-distended. Bowel sounds present. No organomegaly or mass.  EXTREMITIES: No pedal edema, cyanosis, or clubbing.  NEUROLOGIC: Cranial nerves II through XII are intact. Muscle strength 5/5 in all extremities. Sensation intact. Gait not checked.  PSYCHIATRIC: The patient is alert and oriented x 3.  SKIN: No obvious rash, lesion, or ulcer.   DATA REVIEW:   CBC  Recent Labs Lab 10/29/15 0600  WBC 17.6*  HGB 11.8*  HCT 35.7  PLT 239    Chemistries   Recent Labs Lab 10/27/15 1102 10/28/15 0505 10/29/15 0600  NA 137 140  --   K 3.9 3.7  --   CL 100* 109  --   CO2 27 25  --   GLUCOSE 93 131*  --   BUN 16 13  --   CREATININE 0.87 0.69  --   CALCIUM 8.8* 8.1*  --   MG  --   --  2.2  AST 18  --   --   ALT 16  --   --   ALKPHOS 48  --   --   BILITOT 1.4*  --   --     Cardiac Enzymes No results for input(s): TROPONINI in the last 168 hours.  Microbiology Results  Results for orders placed or performed during the hospital encounter of 10/27/15  Culture, blood (routine x 2)     Status: None (Preliminary result)   Collection Time: 10/27/15 11:47 AM  Result Value Ref Range Status   Specimen Description BLOOD RIGHT ARM  Final   Special Requests   Final    BOTTLES DRAWN  AEROBIC AND ANAEROBIC ANA 5CC AERO [place]   Culture NO GROWTH 2 DAYS  Final   Report Status PENDING  Incomplete  Urine culture     Status: None   Collection Time: 10/27/15 11:47 AM  Result Value Ref Range Status   Specimen Description URINE, RANDOM  Final   Special Requests NONE  Final   Culture MULTIPLE SPECIES PRESENT, SUGGEST RECOLLECTION  Final   Report Status 10/28/2015 FINAL  Final  Rapid Influenza A&B Antigens ([facility] only)     Status: None   Collection Time: 10/27/15 11:47 AM  Result Value Ref Range Status   Influenza A ([facility]) NEGATIVE  Final   Influenza B ([facility]) NEGATIVE  Final  Culture, blood (routine x 2)     Status: None (Preliminary result)   Collection Time: 10/27/15 12:09 PM  Result Value Ref Range Status   Specimen Description BLOOD LEFT ASSIST CONTROL  Final   Special Requests BOTTLES DRAWN AEROBIC AND ANAEROBIC 1CC  Final   Culture NO GROWTH 2 DAYS  Final   Report Status PENDING  Incomplete    RADIOLOGY:  Dg Chest 2 View  10/29/2015  CLINICAL DATA:  Fever and sepsis due to pneumonia. Feeling better today. Still with body aches but improved. History of COPD, asthma, hypertension, coronary artery disease. Former smoker. EXAM: CHEST  2 VIEW COMPARISON:  Chest x-rays dated 10/27/2015 and 10/22/2007. Comparison also made to chest CT of 10/27/2015. FINDINGS: Cardiomediastinal silhouette is stable in size and configuration. Lungs are hyperexpanded suggesting COPD. Associated chronic bronchitic changes centrally. Associated mild scarring/fibrosis within each lung. There is a persistent retrocardiac pneumonia, medial aspects of the right lower lobe better seen on recent chest CT of 10/27/2015. No new lung findings. No pleural effusions seen. No osseous abnormality. IMPRESSION: 1. Persistent right lower lobe pneumonia, retrocardiac right lower lobe, stable radiographically, better seen on earlier chest CT  of 10/27/2015. No new lung findings. 2. Hyperexpanded lungs suggesting COPD. Chronic  bronchitic changes centrally. Additional mild scarring/fibrosis within each lung. Electronically Signed   By: Franki Cabot M.D.   On: 10/29/2015 11:15   Ct Angio Chest Aorta W/cm &/or Wo/cm  10/27/2015  CLINICAL DATA:  Chest pain. Fever and generalized body aches for 2 days. EXAM: CT ANGIOGRAPHY CHEST, ABDOMEN AND PELVIS TECHNIQUE: Multidetector CT imaging through the chest, abdomen and pelvis was performed using the standard protocol during bolus administration of intravenous contrast. Multiplanar reconstructed images and MIPs were obtained and reviewed to evaluate the vascular anatomy. CONTRAST:  184mL OMNIPAQUE IOHEXOL 350 MG/ML SOLN COMPARISON:  Chest CT 01/22/2014 FINDINGS: CTA CHEST FINDINGS THORACIC INLET/BODY WALL: No acute abnormality. MEDIASTINUM: Normal heart size and no pericardial effusion. Mild anterior pericardial thickening is stable. Moderate aortic valve calcifications/ sclerosis. Extensive atherosclerosis, including the coronary arteries. A fusiform distal thoracic aorta aneurysm with mural thrombus measures 39 mm maximal outer wall diameter, increased by 2 mm compared to prior. There is no evidence of acute aortic syndrome including rupture, dissection, or intramural hematoma. No plaque ulceration. No evidence of pulmonary embolism. LUNG WINDOWS: Dense consolidation in the superior and medial right lower lobe. There is broad pleural contact which could explain history of chest pain. Calcified granulomas in the right lung. No suspicious pulmonary nodule. No pleural effusion or cavitation. OSSEOUS: No acute fracture.  No suspicious lytic or blastic lesions. Review of the MIP images confirms the above findings. CTA ABDOMEN AND PELVIS FINDINGS Hepatobiliary: Heterogeneous liver density which is likely from contrast timing. There are subtle hypervascular areas, pattern also seen on comparison study and likely small shunts.Cholecystectomy. Pancreas: Coarse calcifications in the uncinate,  nonspecific. No duct enlargement or active inflammation. Spleen: Unremarkable. Adrenals/Urinary Tract: Negative adrenals. No hydronephrosis or stone. Unremarkable bladder. Reproductive:Hysterectomy with negative adnexa Stomach/Bowel:  No obstruction. No appendicitis. Vascular/Lymphatic: Right hepatic artery is replaced to the SMA. Otherwise, abdominal aortic branching is standard. Diffuse atheromatous wall calcification and thickening. No aneurysm or major branch occlusion. No dissection or inflammatory wall thickening. No mass or adenopathy. Peritoneal: No ascites or pneumoperitoneum. Musculoskeletal: No acute abnormalities. Review of the MIP images confirms the above findings. IMPRESSION: 1. Right lower lobe pneumonia. The opacity is subtle by chest x-ray but visible laterally. Followup PA and lateral chest X-ray is recommended in 3-4 weeks following trial of antibiotic therapy to ensure resolution. 2. No acute aortic finding. 3. Fusiform descending thoracic aortic aneurysm measures 39 mm in diameter, 2 mm growth since April 2015. Electronically Signed   By: Monte Fantasia M.D.   On: 10/27/2015 14:19   Ct Cta Abd/pel W/cm &/or W/o Cm  10/27/2015  CLINICAL DATA:  Chest pain. Fever and generalized body aches for 2 days. EXAM: CT ANGIOGRAPHY CHEST, ABDOMEN AND PELVIS TECHNIQUE: Multidetector CT imaging through the chest, abdomen and pelvis was performed using the standard protocol during bolus administration of intravenous contrast. Multiplanar reconstructed images and MIPs were obtained and reviewed to evaluate the vascular anatomy. CONTRAST:  182mL OMNIPAQUE IOHEXOL 350 MG/ML SOLN COMPARISON:  Chest CT 01/22/2014 FINDINGS: CTA CHEST FINDINGS THORACIC INLET/BODY WALL: No acute abnormality. MEDIASTINUM: Normal heart size and no pericardial effusion. Mild anterior pericardial thickening is stable. Moderate aortic valve calcifications/ sclerosis. Extensive atherosclerosis, including the coronary arteries. A fusiform  distal thoracic aorta aneurysm with mural thrombus measures 39 mm maximal outer wall diameter, increased by 2 mm compared to prior. There is no evidence of acute aortic syndrome including rupture, dissection, or  intramural hematoma. No plaque ulceration. No evidence of pulmonary embolism. LUNG WINDOWS: Dense consolidation in the superior and medial right lower lobe. There is broad pleural contact which could explain history of chest pain. Calcified granulomas in the right lung. No suspicious pulmonary nodule. No pleural effusion or cavitation. OSSEOUS: No acute fracture.  No suspicious lytic or blastic lesions. Review of the MIP images confirms the above findings. CTA ABDOMEN AND PELVIS FINDINGS Hepatobiliary: Heterogeneous liver density which is likely from contrast timing. There are subtle hypervascular areas, pattern also seen on comparison study and likely small shunts.Cholecystectomy. Pancreas: Coarse calcifications in the uncinate, nonspecific. No duct enlargement or active inflammation. Spleen: Unremarkable. Adrenals/Urinary Tract: Negative adrenals. No hydronephrosis or stone. Unremarkable bladder. Reproductive:Hysterectomy with negative adnexa Stomach/Bowel:  No obstruction. No appendicitis. Vascular/Lymphatic: Right hepatic artery is replaced to the SMA. Otherwise, abdominal aortic branching is standard. Diffuse atheromatous wall calcification and thickening. No aneurysm or major branch occlusion. No dissection or inflammatory wall thickening. No mass or adenopathy. Peritoneal: No ascites or pneumoperitoneum. Musculoskeletal: No acute abnormalities. Review of the MIP images confirms the above findings. IMPRESSION: 1. Right lower lobe pneumonia. The opacity is subtle by chest x-ray but visible laterally. Followup PA and lateral chest X-ray is recommended in 3-4 weeks following trial of antibiotic therapy to ensure resolution. 2. No acute aortic finding. 3. Fusiform descending thoracic aortic aneurysm  measures 39 mm in diameter, 2 mm growth since April 2015. Electronically Signed   By: Monte Fantasia M.D.   On: 10/27/2015 14:19    EKG:   Orders placed or performed during the hospital encounter of 10/27/15  . EKG 12-Lead  . EKG 12-Lead      Management plans discussed with the patient, family and they are in agreement.  CODE STATUS:     Code Status Orders        Start     Ordered   10/27/15 1613  Full code   Continuous     10/27/15 1612    Advance Directive Documentation        Most Recent Value   Type of Advance Directive  Living will   Pre-existing out of facility DNR order (yellow form or pink MOST form)     "MOST" Form in Place?        TOTAL TIME TAKING CARE OF THIS PATIENT: 40  minutes.    Theodoro Grist M.D on 10/29/2015 at 12:02 PM  Between 7am to 6pm - Pager - [phone]  After 6pm go to [email or web address] - password [email or web address] [facility]  Office  [phone]  CC: Primary care physician; Madelyn Brunner, MD

## 2015-10-29 NOTE — Clinical Documentation Improvement (Signed)
Internal Medicine  Can the diagnosis of CKD be further specified? Please document findings in next progress note; NOT in BPA drop down box. Thanks!   CKD Stage I - GFR greater than or equal to 90  CKD Stage II - GFR 60-89  CKD Stage III - GFR 30-59  CKD Stage IV - GFR 15-29  CKD Stage V - GFR < 15  ESRD (End Stage Renal Disease)  Other condition  Unable to clinically determine  Supporting Information: : (risk factors, signs and symptoms, diagnostics, treatment)  GFR's for current admission running  > 60  Please exercise your independent, professional judgment when responding. A specific answer is not anticipated or expected.  Thank You, [place] [phone]

## 2015-11-01 LAB — CULTURE, BLOOD (ROUTINE X 2)
CULTURE: NO GROWTH
CULTURE: NO GROWTH

## 2015-11-17 ENCOUNTER — Ambulatory Visit
Admission: EM | Admit: 2015-11-17 | Discharge: 2015-11-17 | Disposition: A | Discharge status: Home / Self Care | Payer: Medicare Other | Attending: Family Medicine | Admitting: Family Medicine

## 2015-11-17 ENCOUNTER — Encounter: Payer: Self-pay | Admitting: Emergency Medicine

## 2015-11-17 DIAGNOSIS — W548XXA Other contact with dog, initial encounter: Secondary | ICD-10-CM | POA: Diagnosis not present

## 2015-11-17 DIAGNOSIS — T148 Other injury of unspecified body region: Secondary | ICD-10-CM | POA: Diagnosis not present

## 2015-11-17 DIAGNOSIS — S61402A Unspecified open wound of left hand, initial encounter: Secondary | ICD-10-CM

## 2015-11-17 MED ORDER — AZITHROMYCIN 250 MG PO TABS: ORAL_TABLET | ORAL | Status: DC

## 2015-11-17 NOTE — Discharge Instructions (Signed)
Stitches, Staples, or Adhesive Wound Closure  °Health care providers use stitches (sutures), staples, and certain glue (skin adhesives) to hold skin together while it heals (wound closure). You may need this treatment after you have surgery or if you cut your skin accidentally. These methods help your skin to heal more quickly and make it less likely that you will have a scar. A wound may take several months to heal completely.  °The type of wound you have determines when your wound gets closed. In most cases, the wound is closed as soon as possible (primary skin closure). Sometimes, closure is delayed so the wound can be cleaned and allowed to heal naturally. This reduces the chance of infection. Delayed closure may be needed if your wound:  °Is caused by a bite.  °Happened more than 6 hours ago.  °Involves loss of skin or the tissues under the skin.  °Has dirt or debris in it that cannot be removed.  °Is infected. °WHAT ARE THE DIFFERENT KINDS OF WOUND CLOSURES?  °There are many options for wound closure. The one that your health care provider uses depends on how deep and how large your wound is.  °Adhesive Glue  °To use this type of glue to close a wound, your health care provider holds the edges of the wound together and paints the glue on the surface of your skin. You may need more than one layer of glue. Then the wound may be covered with a light bandage (dressing).  °This type of skin closure may be used for small wounds that are not deep (superficial). Using glue for wound closure is less painful than other methods. It does not require a medicine that numbs the area (local anesthetic). This method also leaves nothing to be removed. Adhesive glue is often used for children and on facial wounds.  °Adhesive glue cannot be used for wounds that are deep, uneven, or bleeding. It is not used inside of a wound.  °Adhesive Strips  °These strips are made of sticky (adhesive), porous paper. They are applied across your  skin edges like a regular adhesive bandage. You leave them on until they fall off.  °Adhesive strips may be used to close very superficial wounds. They may also be used along with sutures to improve the closure of your skin edges.  °Sutures  °Sutures are the oldest method of wound closure. Sutures can be made from natural substances, such as silk, or from synthetic materials, such as nylon and steel. They can be made from a material that your body can break down as your wound heals (absorbable), or they can be made from a material that needs to be removed from your skin (nonabsorbable). They come in many different strengths and sizes.  °Your health care provider attaches the sutures to a steel needle on one end. Sutures can be passed through your skin, or through the tissues beneath your skin. Then they are tied and cut. Your skin edges may be closed in one continuous stitch or in separate stitches.  °Sutures are strong and can be used for all kinds of wounds. Absorbable sutures may be used to close tissues under the skin. The disadvantage of sutures is that they may cause skin reactions that lead to infection. Nonabsorbable sutures need to be removed.  °Staples  °When surgical staples are used to close a wound, the edges of your skin on both sides of the wound are brought close together. A staple is placed across the wound, and   an instrument secures the edges together. Staples are often used to close surgical cuts (incisions).  °Staples are faster to use than sutures, and they cause less skin reaction. Staples need to be removed using a tool that bends the staples away from your skin.  °HOW DO I CARE FOR MY WOUND CLOSURE?  °Take medicines only as directed by your health care provider.  °If you were prescribed an antibiotic medicine for your wound, finish it all even if you start to feel better.  °Use ointments or creams only as directed by your health care provider.  °Wash your hands with soap and water before and  after touching your wound.  °Do not soak your wound in water. Do not take baths, swim, or use a hot tub until your health care provider approves.  °Ask your health care provider when you can start showering. Cover your wound if directed by your health care provider.  °Do not take out your own sutures or staples.  °Do not pick at your wound. Picking can cause an infection.  °Keep all follow-up visits as directed by your health care provider. This is important. °HOW LONG WILL I HAVE MY WOUND CLOSURE?  °Leave adhesive glue on your skin until the glue peels away.  °Leave adhesive strips on your skin until the strips fall off.  °Absorbable sutures will dissolve within several days.  °Nonabsorbable sutures and staples must be removed. The location of the wound will determine how long they stay in. This can range from several days to a couple of weeks. °WHEN SHOULD I SEEK HELP FOR MY WOUND CLOSURE?  °Contact your health care provider if:  °You have a fever.  °You have chills.  °You have drainage, redness, swelling, or pain at your wound.  °There is a bad smell coming from your wound.  °The skin edges of your wound start to separate after your sutures have been removed.  °Your wound becomes thick, raised, and darker in color after your sutures come out (scarring). °This information is not intended to replace advice given to you by your health care provider. Make sure you discuss any questions you have with your health care provider.  °Document Released: 07/03/2001 Document Revised: 10/29/2014 Document Reviewed: 03/17/2014  °Elsevier Interactive Patient Education ©2016 [employer]  ° °

## 2015-11-17 NOTE — ED Provider Notes (Addendum)
CSN: [id number]     Arrival date & time 11/17/15  1438 History   First MD Initiated Contact with Patient 11/17/15 1542    .Nurses notes were reviewed. Chief Complaint  Patient presents with  . Laceration   Patient is here due to dog scratch. She states the low dog was the car with her and is a made a sudden stop the dog's basically stepped on her left hand tearing the skin. She has just come off the hospital because of pneumonia and she just finished Levaquin on Monday she is allergic to multiple antibiotics and has had multiple skin lesions and skin cancers removed. She has a history of cornea artery disease hyperlipidemia and thyroid disease as well.   (Consider location/radiation/quality/duration/timing/severity/associated sxs/prior Treatment) Patient is a 82 y.o. female presenting with skin laceration. The history is provided by the patient. No language interpreter was used.  Laceration Location:  Hand Hand laceration location:  [place] of L hand Depth:  Cutaneous Time since incident:  3 hours Injury mechanism: Dog claw. Pain details:    Quality:  Aching   Severity:  Mild Foreign body present:  No foreign bodies Worsened by:  Nothing tried Ineffective treatments:  None tried   Past Medical History  Diagnosis Date  . COPD (chronic obstructive pulmonary disease) (HCC)     on nocturnal o2  . Asthma   . Hypertension   . UTI (lower urinary tract infection)   . Aneurysm ([place])     thoracic aortic aneurysm  . Pulmonary emphysema ([place])   . Aortic insufficiency   . Aneurysm, thoracic aortic (HCC) Abdominal Aneurysm  . CAD (coronary artery disease)     no h/o stents or CABG  . Hyperlipidemia   . Thyroid disease     hypo  . Constipation   . History of adenomatous polyp of colon   . Skin cancer   . History of uterine cancer   . Diastolic CHF ([place])   . Allergy   . Anxiety   . Arthritis   . Cataract Bilateral Eyes   Past Surgical History  Procedure Laterality Date  . Breast  cyst aspiration Left 1985    neg  . Cholecystectomy    . Abdominal hysterectomy    . Shoulder arthroscopy w/ capsular repair      Left  . Toe surgery      Right toe surgery   Family History  Problem Relation Age of Onset  . Breast cancer Cousin   . Heart attack Mother   . Hypertension Mother   . Bladder Cancer Father   . Prostate cancer Father   . Diabetes Sister   . Prostate cancer Brother   . AAA (abdominal aortic aneurysm) Brother    Social History  Substance Use Topics  . Smoking status: Former Smoker -- 20 years  . Smokeless tobacco: Never Used     Comment: Quit 2 years ago.   . Alcohol Use: 0.0 oz/week    0 Standard drinks or equivalent per week     Comment: rarely   OB History    No data available     Review of Systems  All other systems reviewed and are negative.   Allergies  Epinephrine; Iodine; Lopid; Lovastatin; Neosporin; Polysporin; Vesicare; Penicillin v potassium; and Penicillins  Home Medications   Prior to Admission medications   Medication Sig Start Date End Date Taking? Authorizing Provider  albuterol (PROVENTIL HFA;VENTOLIN HFA) 108 (90 Base) MCG/ACT inhaler Inhale 2 puffs into the lungs  every 6 (six) hours as needed for wheezing.    Historical Provider, MD  albuterol (PROVENTIL) (2.5 MG/3ML) 0.083% nebulizer solution Take 2.5 mg by nebulization every 6 (six) hours as needed for wheezing.    Historical Provider, MD  ALPRAZolam Duanne Moron) 0.25 MG tablet Take 0.25 mg by mouth 3 (three) times daily as needed for anxiety.    Historical Provider, MD  amLODipine (NORVASC) 5 MG tablet Take 1 tablet by mouth daily. 10/10/15   Historical Provider, MD  atorvastatin (LIPITOR) 20 MG tablet Take 1 tablet by mouth daily. 08/08/15   Historical Provider, MD  azithromycin (ZITHROMAX Z-PAK) 250 MG tablet Take 2 tablets first day and then 1 po a day for 4 days 11/17/15   Frederich Cha, MD  azithromycin (ZITHROMAX Z-PAK) 250 MG tablet Take 2 tablets first day and then 1 po  a day for 4 days 11/17/15   Frederich Cha, MD  bisacodyl (DULCOLAX) 10 MG suppository Place 1 suppository (10 mg total) rectally daily. 10/29/15   Theodoro Grist, MD  chlorpheniramine-HYDROcodone (TUSSIONEX) 10-8 MG/5ML SUER Take 5 mLs by mouth every 12 (twelve) hours as needed for cough. 10/29/15   Theodoro Grist, MD  Cholecalciferol (VITAMIN D3) 5000 units TABS Take 1 tablet by mouth daily.    Historical Provider, MD  docusate sodium (COLACE) 100 MG capsule Take 1 capsule (100 mg total) by mouth 2 (two) times daily. 10/29/15   Theodoro Grist, MD  fluticasone (FLOVENT HFA) 220 MCG/ACT inhaler Inhale 1 puff into the lungs 2 (two) times daily.    Historical Provider, MD  furosemide (LASIX) 20 MG tablet Take 1 tablet by mouth daily. 09/14/15   Historical Provider, MD  guaiFENesin (MUCINEX) 600 MG 12 hr tablet Take 1 tablet (600 mg total) by mouth 2 (two) times daily. 10/29/15   Theodoro Grist, MD  hydrocortisone 2.5 % cream Apply 1 application topically 2 (two) times daily.    Historical Provider, MD  ketoconazole (NIZORAL) 2 % shampoo Apply 1 application topically 2 (two) times a week.    Historical Provider, MD  levofloxacin (LEVAQUIN) 750 MG tablet Take 1 tablet (750 mg total) by mouth daily. 10/29/15   Theodoro Grist, MD  levothyroxine (SYNTHROID, LEVOTHROID) 75 MCG tablet Take 1 tablet by mouth daily. 10/10/15   Historical Provider, MD  mupirocin ointment (BACTROBAN) 2 % Place 1 application into the nose 2 (two) times daily.    Historical Provider, MD  potassium chloride SA (K-DUR,KLOR-CON) 20 MEQ tablet Take 1 tablet by mouth daily. 09/14/15   Historical Provider, MD  predniSONE (DELTASONE) 5 MG tablet Take 1 tablet by mouth daily. 10/25/15   Historical Provider, MD  SPIRIVA RESPIMAT 2.5 MCG/ACT AERS Take 2 puffs by mouth daily. 10/25/15   Historical Provider, MD   Meds Ordered and Administered this Visit  Medications - No data to display  BP 135/61 mmHg  Pulse 73  Temp(Src) 97.7 F (36.5 C) (Tympanic)  Resp  16  Ht 5\' 5"  (1.651 m)  Wt 135 lb (61.236 kg)  BMI 22.47 kg/m2  SpO2 99% No data found.   Physical Exam  Constitutional: She appears well-developed and well-nourished. No distress.  HENT:  Head: Normocephalic and atraumatic.  Musculoskeletal: Normal range of motion. She exhibits tenderness.       Left hand: She exhibits laceration.       Hands: Neurological: She is alert.  Skin: Skin is warm and dry. No rash noted.  Psychiatric: She has a normal mood and affect.  Vitals reviewed.  ED Course  .Marland KitchenLaceration Repair Date/Time: 11/17/2015 4:34 PM Performed by: Frederich Cha Authorized by: Frederich Cha Consent: Verbal consent obtained. Consent given by: patient Body area: upper extremity Location details: left hand Laceration length: 5 cm Foreign bodies: no foreign bodies Tendon involvement: none Nerve involvement: none Vascular damage: no Patient sedated: no Amount of cleaning: standard Debridement: none Degree of undermining: none Skin closure: glue Approximation: close Approximation difficulty: simple Dressing: 4x4 sterile gauze Comments: Using Dermabond on the wound the wound was closed. First hand was soaked and some scrubbing solution then surgical scrub was also used to clean the wound. Dermabond was applied with after the flap was teased out with good approximation of the borders.   (including critical care time)  Labs Review Labs Reviewed - No data to display  Imaging Review No results found.   Visual Acuity Review  Right Eye Distance:   Left Eye Distance:   Bilateral Distance:    Right Eye Near:   Left Eye Near:    Bilateral Near:         MDM   1. Dog scratch   2. Hand, open wound except fingers, complicated, left, initial encounter     Patient reports last tetanus was about a year ago. She just came off Levaquin but because of the dog scratch infection will place her on a Z-Pak which was sent to the drugstore of her choice. Dermabond  instructions verbally will be given to patient. Follow-up PCP as needed in 3-7 days. Patient tolerated the Dermabond application well    Frederich Cha, MD 11/17/15 [place], MD 11/18/15 [phone]

## 2015-11-17 NOTE — ED Notes (Signed)
Patient states that her dog slid off the couch and the dog's nail scratched the top of her left hand this morning.

## 2016-04-06 ENCOUNTER — Other Ambulatory Visit: Payer: Self-pay | Admitting: Vascular Surgery

## 2016-04-06 DIAGNOSIS — I712 Thoracic aortic aneurysm, without rupture, unspecified: Secondary | ICD-10-CM

## 2016-04-16 ENCOUNTER — Ambulatory Visit
Admission: RE | Admit: 2016-04-16 | Discharge: 2016-04-16 | Disposition: A | Discharge status: Home / Self Care | Payer: Medicare Other | Source: Ambulatory Visit | Attending: Vascular Surgery | Admitting: Vascular Surgery

## 2016-04-16 DIAGNOSIS — I712 Thoracic aortic aneurysm, without rupture: Secondary | ICD-10-CM | POA: Diagnosis present

## 2016-04-16 LAB — POCT I-STAT CREATININE: Creatinine, Ser: 1 mg/dL (ref 0.44–1.00)

## 2016-04-16 MED ORDER — IOPAMIDOL (ISOVUE-370) INJECTION 76%
75.0000 mL | Freq: Once | INTRAVENOUS | Status: AC | PRN
  Administered 2016-04-16: 75 mL via INTRAVENOUS

## 2016-04-17 ENCOUNTER — Ambulatory Visit: Admission: RE | Admit: 2016-04-17 | Payer: Medicare Other | Source: Ambulatory Visit

## 2016-05-17 ENCOUNTER — Other Ambulatory Visit: Payer: Self-pay | Admitting: Specialist

## 2016-05-17 ENCOUNTER — Encounter
Admission: RE | Admit: 2016-05-17 | Discharge: 2016-05-17 | Disposition: A | Discharge status: Home / Self Care | Payer: Medicare Other | Source: Ambulatory Visit | Attending: Specialist | Admitting: Specialist

## 2016-05-17 ENCOUNTER — Ambulatory Visit
Admission: RE | Admit: 2016-05-17 | Discharge: 2016-05-17 | Disposition: A | Discharge status: Home / Self Care | Payer: Medicare Other | Source: Ambulatory Visit | Attending: Specialist | Admitting: Specialist

## 2016-05-17 DIAGNOSIS — R918 Other nonspecific abnormal finding of lung field: Secondary | ICD-10-CM | POA: Insufficient documentation

## 2016-05-17 DIAGNOSIS — R7989 Other specified abnormal findings of blood chemistry: Secondary | ICD-10-CM

## 2016-05-17 DIAGNOSIS — R0602 Shortness of breath: Secondary | ICD-10-CM

## 2016-05-17 MED ORDER — TECHNETIUM TC 99M DIETHYLENETRIAME-PENTAACETIC ACID
30.0000 | Freq: Once | INTRAVENOUS | Status: AC | PRN
  Administered 2016-05-17: 32.72 via INTRAVENOUS

## 2016-05-17 MED ORDER — TECHNETIUM TO 99M ALBUMIN AGGREGATED
4.0000 | Freq: Once | INTRAVENOUS | Status: AC | PRN
  Administered 2016-05-17: 4.26 via INTRAVENOUS

## 2016-11-15 ENCOUNTER — Other Ambulatory Visit: Payer: Self-pay | Admitting: Obstetrics and Gynecology

## 2016-11-15 DIAGNOSIS — Z1231 Encounter for screening mammogram for malignant neoplasm of breast: Secondary | ICD-10-CM

## 2016-12-27 ENCOUNTER — Ambulatory Visit
Admission: RE | Admit: 2016-12-27 | Discharge: 2016-12-27 | Disposition: A | Discharge status: Home / Self Care | Payer: Medicare Other | Source: Ambulatory Visit | Attending: Obstetrics and Gynecology | Admitting: Obstetrics and Gynecology

## 2016-12-27 DIAGNOSIS — Z1231 Encounter for screening mammogram for malignant neoplasm of breast: Secondary | ICD-10-CM | POA: Diagnosis present

## 2017-06-03 ENCOUNTER — Other Ambulatory Visit: Payer: Self-pay | Admitting: Physician Assistant

## 2017-06-03 ENCOUNTER — Ambulatory Visit
Admission: RE | Admit: 2017-06-03 | Discharge: 2017-06-03 | Disposition: A | Discharge status: Home / Self Care | Payer: Medicare Other | Source: Ambulatory Visit | Attending: Physician Assistant | Admitting: Physician Assistant

## 2017-06-03 DIAGNOSIS — M79604 Pain in right leg: Secondary | ICD-10-CM

## 2017-06-03 DIAGNOSIS — M7989 Other specified soft tissue disorders: Secondary | ICD-10-CM | POA: Diagnosis not present

## 2017-07-03 ENCOUNTER — Ambulatory Visit
Admission: EM | Admit: 2017-07-03 | Discharge: 2017-07-03 | Disposition: A | Discharge status: Home / Self Care | Payer: Medicare Other | Attending: Family Medicine | Admitting: Family Medicine

## 2017-07-03 DIAGNOSIS — S81812A Laceration without foreign body, left lower leg, initial encounter: Secondary | ICD-10-CM | POA: Diagnosis not present

## 2017-07-03 MED ORDER — MUPIROCIN 2 % EX OINT: TOPICAL_OINTMENT | CUTANEOUS | 0 refills | Status: AC

## 2017-07-03 NOTE — ED Triage Notes (Signed)
PT reports putting on compression socks today and scraping left lower leg with finger nail causing large skin tear. Bleeding controlled at this time.

## 2017-07-03 NOTE — ED Provider Notes (Signed)
[facility] ____________________________________________  Time seen: Approximately 2:40 PM  I have reviewed the triage vital signs and the nursing notes.   HISTORY  Chief Complaint No chief complaint on file.   HPI Danielle Williamson is a 83 y.o. female presenting for evaluation of open wound to left lower leg since this morning. Patient states she was trying to pull up her TED hose and her thumb nail cut her leg. Patient states minimal pain to the ER at this time. No alleviating measures attempt prior to arrival. Patient states that she believes her tetanus immunization is up-to-date. Denies fall or direct blow injury. States has had some bleeding and oozing from the wound since injury. Has remained ambulatory.. Denies pain with ambulation. Reports she does have some chronic bilateral lower extremity edema, no change from her baseline. Reports otherwise feels well.  Denies chest pain, shortness of breath, or rash. Denies recent sickness. Denies recent antibiotic use.   Madelyn Brunner, MD: PCP   Past Medical History:  Diagnosis Date  . Allergy   . Aneurysm ([place])    thoracic aortic aneurysm  . Aneurysm, thoracic aortic (HCC) Abdominal Aneurysm  . Anxiety   . Aortic insufficiency   . Arthritis   . Asthma   . CAD (coronary artery disease)    no h/o stents or CABG  . Cataract Bilateral Eyes  . Constipation   . COPD (chronic obstructive pulmonary disease) (HCC)    on nocturnal o2  . Diastolic CHF ([place])   . History of adenomatous polyp of colon   . History of uterine cancer   . Hyperlipidemia   . Hypertension   . Pulmonary emphysema ([place])   . Skin cancer   . Thyroid disease    hypo  . UTI (lower urinary tract infection)     Patient Active Problem List   Diagnosis Date Noted  . Right lower lobe pneumonia ([place]) 10/29/2015  . Pleuritic chest pain 10/29/2015  . Dyspnea 10/29/2015  . General weakness 10/29/2015  . Leukocytosis 10/29/2015  . Sepsis ([place])  10/27/2015    Past Surgical History:  Procedure Laterality Date  . ABDOMINAL HYSTERECTOMY    . BREAST CYST ASPIRATION Left 1985   neg  . CHOLECYSTECTOMY    . SHOULDER ARTHROSCOPY W/ CAPSULAR REPAIR     Left  . TOE SURGERY     Right toe surgery     No current facility-administered medications for this encounter.   Current Outpatient Prescriptions:  .  albuterol (PROVENTIL HFA;VENTOLIN HFA) 108 (90 Base) MCG/ACT inhaler, Inhale 2 puffs into the lungs every 6 (six) hours as needed for wheezing., Disp: , Rfl:  .  albuterol (PROVENTIL) (2.5 MG/3ML) 0.083% nebulizer solution, Take 2.5 mg by nebulization every 6 (six) hours as needed for wheezing., Disp: , Rfl:  .  ALPRAZolam (XANAX) 0.25 MG tablet, Take 0.25 mg by mouth 3 (three) times daily as needed for anxiety., Disp: , Rfl:  .  amLODipine (NORVASC) 5 MG tablet, Take 1 tablet by mouth daily., Disp: , Rfl: 4 .  atorvastatin (LIPITOR) 20 MG tablet, Take 1 tablet by mouth daily., Disp: , Rfl: 2 .  azithromycin (ZITHROMAX Z-PAK) 250 MG tablet, Take 2 tablets first day and then 1 po a day for 4 days, Disp: 6 tablet, Rfl: 0 .  azithromycin (ZITHROMAX Z-PAK) 250 MG tablet, Take 2 tablets first day and then 1 po a day for 4 days, Disp: 6 tablet, Rfl: 0 .  bisacodyl (DULCOLAX) 10 MG  suppository, Place 1 suppository (10 mg total) rectally daily., Disp: 12 suppository, Rfl: 0 .  chlorpheniramine-HYDROcodone (TUSSIONEX) 10-8 MG/5ML SUER, Take 5 mLs by mouth every 12 (twelve) hours as needed for cough., Disp: 140 mL, Rfl: 0 .  Cholecalciferol (VITAMIN D3) 5000 units TABS, Take 1 tablet by mouth daily., Disp: , Rfl:  .  docusate sodium (COLACE) 100 MG capsule, Take 1 capsule (100 mg total) by mouth 2 (two) times daily., Disp: 10 capsule, Rfl: 0 .  fluticasone (FLOVENT HFA) 220 MCG/ACT inhaler, Inhale 1 puff into the lungs 2 (two) times daily., Disp: , Rfl:  .  furosemide (LASIX) 20 MG tablet, Take 1 tablet by mouth daily., Disp: , Rfl: 5 .   guaiFENesin (MUCINEX) 600 MG 12 hr tablet, Take 1 tablet (600 mg total) by mouth 2 (two) times daily., Disp: 20 tablet, Rfl: 2 .  hydrocortisone 2.5 % cream, Apply 1 application topically 2 (two) times daily., Disp: , Rfl:  .  ketoconazole (NIZORAL) 2 % shampoo, Apply 1 application topically 2 (two) times a week., Disp: , Rfl:  .  levofloxacin (LEVAQUIN) 750 MG tablet, Take 1 tablet (750 mg total) by mouth daily., Disp: 5 tablet, Rfl: 0 .  levothyroxine (SYNTHROID, LEVOTHROID) 75 MCG tablet, Take 1 tablet by mouth daily., Disp: , Rfl: 10 .  mupirocin ointment (BACTROBAN) 2 %, Place 1 application into the nose 2 (two) times daily., Disp: , Rfl:  .  mupirocin ointment (BACTROBAN) 2 %, Apply two times a day for 7 days., Disp: 22 g, Rfl: 0 .  potassium chloride SA (K-DUR,KLOR-CON) 20 MEQ tablet, Take 1 tablet by mouth daily., Disp: , Rfl: 5 .  predniSONE (DELTASONE) 5 MG tablet, Take 1 tablet by mouth daily., Disp: , Rfl: 1 .  SPIRIVA RESPIMAT 2.5 MCG/ACT AERS, Take 2 puffs by mouth daily., Disp: , Rfl: 10  Allergies Epinephrine; Iodine; Lopid [gemfibrozil]; Lovastatin; Neosporin [neomycin-bacitracin zn-polymyx]; Polysporin [bacitracin-polymyxin b]; Vesicare [solifenacin]; Penicillin v potassium; and Penicillins  Family History  Problem Relation Age of Onset  . Breast cancer Cousin   . Heart attack Mother   . Hypertension Mother   . Bladder Cancer Father   . Prostate cancer Father   . Diabetes Sister   . Prostate cancer Brother   . AAA (abdominal aortic aneurysm) Brother     Social History Social History  Substance Use Topics  . Smoking status: Former Smoker    Years: 20.00  . Smokeless tobacco: Never Used     Comment: Quit 2 years ago.   . Alcohol use 0.0 oz/week     Comment: rarely    Review of Systems Constitutional: No fever/chills Cardiovascular: Denies chest pain. Respiratory: Denies shortness of breath.in.  Musculoskeletal: Negative for back pain. Skin: As above.    ____________________________________________   PHYSICAL EXAM:  VITAL SIGNS: ED Triage Vitals  Enc Vitals Group     BP 07/03/17 1235 (!) 152/65     Pulse Rate 07/03/17 1235 79     Resp 07/03/17 1235 16     Temp 07/03/17 1235 97.9 F (36.6 C)     Temp Source 07/03/17 1235 Oral     SpO2 07/03/17 1235 98 %     Weight 07/03/17 1237 135 lb (61.2 kg)     Height --      Head Circumference --      Peak Flow --      Pain Score 07/03/17 1239 3     Pain Loc --  Pain Edu? --      Excl. in [place]? --     Constitutional: Alert and oriented. Well appearing and in no acute distress. Cardiovascular: Normal rate, regular rhythm. Grossly normal heart sounds.  Good peripheral circulation. Respiratory: Normal respiratory effort without tachypnea nor retractions. Breath sounds are clear and equal bilaterally. No wheezes, rales, rhonchi. Musculoskeletal:  Steady gait. Bilateral distal pedal pulses equal and easily palpated. Mild bilateral lower extremity edema, per patient chronic and unchanged. Neurologic:  Normal speech and language. Speech is normal. No gait instability.  Skin:  Skin is warm, dry.  Except: Left medial lower leg approximately 2.5 cm flap skin tear present with minimal bleeding and clear serous fluid drainage, mild tenderness to direct palpation, no bony tenderness, no erythema, no foreign body visualized. Psychiatric: Mood and affect are normal. Speech and behavior are normal. Patient exhibits appropriate insight and judgment   ___________________________________________   LABS (all labs ordered are listed, but only abnormal results are displayed)  Labs Reviewed - No data to display ____________________________________________  RADIOLOGY  No results found. ____________________________________________   PROCEDURES Procedures   Procedure(s) performed:  Procedure explained and verbal consent obtained. Consent: Verbal consent obtained. Written consent not  obtained. Risks and benefits: risks, benefits and alternatives were discussed Patient identity confirmed: verbally with patient and hospital-assigned identification number  Consent given by: patient   Left leg wound repair Location: Left leg Length: 2.5 cm Foreign bodies: no foreign bodies Tendon involvement: none Nerve involvement: none Preparation: Patient was prepped and draped in the usual sterile fashion. Anesthesia: None Irrigation solution: saline Irrigation method: jet lavage Amount of cleaning: copious Repaired with  X 3 Steri-Strips and Dermabond3 Approximation: loose Patient tolerate well. Wound well approximated post repair.  Antibiotic ointment and dressing applied.  Wound care instructions provided.  Observe for any signs of infection or other problems.      INITIAL IMPRESSION / ASSESSMENT AND PLAN / ED COURSE  Pertinent labs & imaging results that were available during my care of the patient were reviewed by me and considered in my medical decision making (see chart for details).  Well-appearing patient. No acute distress. Pain at bedside. Left medial lower leg wound. Wound copiously cleaned and irrigated, reapproximated and Steri-Strips and Dermabond utilized. Tetanus immunization TDaP documented in 2016 via care everywhere, and per patient that is correct. Discussed wound care, wound maintenance. Treat topical Bactroban. Discussed follow-up and return parameters. Discussed indication, risks and benefits of medications with patient.  Discussed follow up with Primary care physician this week. Discussed follow up and return parameters including no resolution or any worsening concerns. Patient verbalized understanding and agreed to plan.   ____________________________________________   FINAL CLINICAL IMPRESSION(S) / ED DIAGNOSES  Final diagnoses:  Skin tear of left lower leg without complication, initial encounter     Discharge Medication List as of 07/03/2017   3:12 PM    START taking these medications   Details  !! mupirocin ointment (BACTROBAN) 2 % Apply two times a day for 7 days., Normal     !! - Potential duplicate medications found. Please discuss with provider.      Note: This dictation was prepared with Dragon dictation along with smaller phrase technology. Any transcriptional errors that result from this process are unintentional.         Marylene Land, NP 07/03/17 1548

## 2017-07-03 NOTE — Discharge Instructions (Signed)
Use medication as prescribed. Keep clean and monitor.   Follow up with your primary care physician this week as needed. Return to Urgent care for new or worsening concerns.

## 2017-07-04 ENCOUNTER — Encounter (INDEPENDENT_AMBULATORY_CARE_PROVIDER_SITE_OTHER): Payer: Self-pay | Admitting: Vascular Surgery

## 2017-07-04 ENCOUNTER — Ambulatory Visit (INDEPENDENT_AMBULATORY_CARE_PROVIDER_SITE_OTHER): Payer: Medicare Other | Admitting: Vascular Surgery

## 2017-07-04 DIAGNOSIS — I70223 Atherosclerosis of native arteries of extremities with rest pain, bilateral legs: Secondary | ICD-10-CM

## 2017-07-04 DIAGNOSIS — I712 Thoracic aortic aneurysm, without rupture, unspecified: Secondary | ICD-10-CM

## 2017-07-09 DIAGNOSIS — I712 Thoracic aortic aneurysm, without rupture, unspecified: Secondary | ICD-10-CM | POA: Insufficient documentation

## 2017-07-09 NOTE — Progress Notes (Signed)
Patient was a no-show 

## 2017-07-15 ENCOUNTER — Other Ambulatory Visit (INDEPENDENT_AMBULATORY_CARE_PROVIDER_SITE_OTHER): Payer: Self-pay | Admitting: Vascular Surgery

## 2017-07-15 ENCOUNTER — Ambulatory Visit (INDEPENDENT_AMBULATORY_CARE_PROVIDER_SITE_OTHER): Payer: Medicare Other | Admitting: Vascular Surgery

## 2017-07-15 ENCOUNTER — Encounter (INDEPENDENT_AMBULATORY_CARE_PROVIDER_SITE_OTHER): Payer: Medicare Other

## 2017-07-15 ENCOUNTER — Encounter (INDEPENDENT_AMBULATORY_CARE_PROVIDER_SITE_OTHER): Payer: Self-pay

## 2017-07-15 ENCOUNTER — Other Ambulatory Visit (INDEPENDENT_AMBULATORY_CARE_PROVIDER_SITE_OTHER): Payer: Medicare Other

## 2017-07-15 ENCOUNTER — Encounter (INDEPENDENT_AMBULATORY_CARE_PROVIDER_SITE_OTHER): Payer: Self-pay | Admitting: Vascular Surgery

## 2017-07-15 VITALS — BP 123/71 | HR 83 | Resp 14 | Ht 65.5 in | Wt 136.0 lb

## 2017-07-15 DIAGNOSIS — I83023 Varicose veins of left lower extremity with ulcer of ankle: Secondary | ICD-10-CM

## 2017-07-15 DIAGNOSIS — M79604 Pain in right leg: Secondary | ICD-10-CM

## 2017-07-15 DIAGNOSIS — M79605 Pain in left leg: Secondary | ICD-10-CM

## 2017-07-15 DIAGNOSIS — I872 Venous insufficiency (chronic) (peripheral): Secondary | ICD-10-CM

## 2017-07-15 DIAGNOSIS — I712 Thoracic aortic aneurysm, without rupture, unspecified: Secondary | ICD-10-CM

## 2017-07-15 DIAGNOSIS — L97329 Non-pressure chronic ulcer of left ankle with unspecified severity: Secondary | ICD-10-CM

## 2017-07-16 DIAGNOSIS — I83023 Varicose veins of left lower extremity with ulcer of ankle: Secondary | ICD-10-CM | POA: Insufficient documentation

## 2017-07-16 DIAGNOSIS — I872 Venous insufficiency (chronic) (peripheral): Secondary | ICD-10-CM | POA: Insufficient documentation

## 2017-07-16 DIAGNOSIS — L97329 Non-pressure chronic ulcer of left ankle with unspecified severity: Secondary | ICD-10-CM

## 2017-07-16 NOTE — Progress Notes (Signed)
MRN : [id number]  Danielle Williamson is a 83 y.o. (01-19-1932) female who presents with chief complaint of  Chief Complaint  Patient presents with  . Follow-up    ASAP ABI  .  History of Present Illness: The patient returns to the office for followup and review of the noninvasive studies. There have been no interval changes in lower extremity symptoms. No  Worsening of the patient's claudication distance or development of rest pain symptoms.   Previous ulcer has not healed and continues to be painful  There have been no significant changes to the patient's overall health care.  The patient denies amaurosis fugax or recent TIA symptoms. There are no recent neurological changes noted. The patient denies history of DVT, PE or superficial thrombophlebitis. The patient denies recent episodes of angina or shortness of breath.   ABI Rt=1.21 and Lt=1.24 triphasic signal at the ankles bilaterally    Current Meds  Medication Sig  . albuterol (PROVENTIL HFA;VENTOLIN HFA) 108 (90 Base) MCG/ACT inhaler Inhale 2 puffs into the lungs every 6 (six) hours as needed for wheezing.  Marland Kitchen albuterol (PROVENTIL) (2.5 MG/3ML) 0.083% nebulizer solution Take 2.5 mg by nebulization every 6 (six) hours as needed for wheezing.  Marland Kitchen ALPRAZolam (XANAX) 0.5 MG tablet   . amLODipine (NORVASC) 5 MG tablet Take 1 tablet by mouth daily.  Marland Kitchen atorvastatin (LIPITOR) 20 MG tablet Take 1 tablet by mouth daily.  . bisacodyl (DULCOLAX) 10 MG suppository Place 1 suppository (10 mg total) rectally daily.  . Calcium Carb-Cholecalciferol (CALCIUM-VITAMIN D) 500-200 MG-UNIT tablet Take by mouth.  . chlorpheniramine-HYDROcodone (TUSSIONEX) 10-8 MG/5ML SUER Take 5 mLs by mouth every 12 (twelve) hours as needed for cough.  . Cholecalciferol (VITAMIN D3) 5000 units TABS Take 1 tablet by mouth daily.  Marland Kitchen docusate sodium (COLACE) 100 MG capsule Take 1 capsule (100 mg total) by mouth 2 (two) times daily.  . fluticasone (FLOVENT HFA) 220 MCG/ACT  inhaler Inhale 1 puff into the lungs 2 (two) times daily.  . furosemide (LASIX) 20 MG tablet Take 1 tablet by mouth daily.  Marland Kitchen guaiFENesin (MUCINEX) 600 MG 12 hr tablet Take 1 tablet (600 mg total) by mouth 2 (two) times daily.  . hydrocortisone 2.5 % cream Apply 1 application topically 2 (two) times daily.  Marland Kitchen ketoconazole (NIZORAL) 2 % shampoo Apply 1 application topically 2 (two) times a week.  . levothyroxine (SYNTHROID, LEVOTHROID) 88 MCG tablet   . meloxicam (MOBIC) 7.5 MG tablet   . mupirocin ointment (BACTROBAN) 2 % Apply two times a day for 7 days.  . potassium chloride SA (K-DUR,KLOR-CON) 20 MEQ tablet Take 1 tablet by mouth daily.  . predniSONE (DELTASONE) 5 MG tablet Take 1 tablet by mouth daily.  . Probiotic Product (PROBIOTIC-10) CAPS Take by mouth.  . sertraline (ZOLOFT) 50 MG tablet   . SPIRIVA RESPIMAT 2.5 MCG/ACT AERS Take 2 puffs by mouth daily.  . SYMBICORT 160-4.5 MCG/ACT inhaler   . torsemide (DEMADEX) 20 MG tablet Take by mouth.  . triamcinolone cream (KENALOG) 0.1 %     Past Medical History:  Diagnosis Date  . Allergy   . Aneurysm ([place])    thoracic aortic aneurysm  . Aneurysm, thoracic aortic (HCC) Abdominal Aneurysm  . Anxiety   . Aortic insufficiency   . Arthritis   . Asthma   . CAD (coronary artery disease)    no h/o stents or CABG  . Cataract Bilateral Eyes  . Constipation   . COPD (chronic obstructive  pulmonary disease) ([place])    on nocturnal o2  . Diastolic CHF ([place])   . History of adenomatous polyp of colon   . History of uterine cancer   . Hyperlipidemia   . Hypertension   . Pulmonary emphysema ([place])   . Skin cancer   . Thyroid disease    hypo  . UTI (lower urinary tract infection)     Past Surgical History:  Procedure Laterality Date  . ABDOMINAL HYSTERECTOMY    . BREAST CYST ASPIRATION Left 1985   neg  . CHOLECYSTECTOMY    . SHOULDER ARTHROSCOPY W/ CAPSULAR REPAIR     Left  . TOE SURGERY     Right toe surgery    Social  History Social History  Substance Use Topics  . Smoking status: Former Smoker    Years: 20.00  . Smokeless tobacco: Never Used     Comment: Quit 2 years ago.   . Alcohol use 0.0 oz/week     Comment: rarely    Family History Family History  Problem Relation Age of Onset  . Breast cancer Cousin   . Heart attack Mother   . Hypertension Mother   . Bladder Cancer Father   . Prostate cancer Father   . Diabetes Sister   . Prostate cancer Brother   . AAA (abdominal aortic aneurysm) Brother     Allergies  Allergen Reactions  . Epinephrine   . Furosemide     Other reaction(s): Other (See Comments)  . Lopid [Gemfibrozil] Other (See Comments)    Other reaction(s): Muscle Pain Reaction: Muscle pain  . Lovastatin Other (See Comments)    Other reaction(s): Muscle Pain Reaction: Muscle pain  . Neosporin [Neomycin-Bacitracin Zn-Polymyx]   . Polysporin [Bacitracin-Polymyxin B]   . Sertraline Nausea Only  . Iodine Rash    Hives per patient (10/27/15)   . Moxifloxacin Palpitations  . Penicillin V Potassium Rash  . Penicillins Rash  . Sulfa Antibiotics Rash  . Vesicare [Solifenacin] Other (See Comments) and Palpitations    Reaction: Palpations     REVIEW OF SYSTEMS (Negative unless checked)  Constitutional: [] Weight loss  [] Fever  [] Chills Cardiac: [] Chest pain   [] Chest pressure   [] Palpitations   [] Shortness of breath when laying flat   [] Shortness of breath with exertion. Vascular:  [] Pain in legs with walking   [x] Pain in legs with standing  [] History of DVT   [] Phlebitis   [x] Swelling in legs   [x] Varicose veins   [x] Non-healing ulcers Pulmonary:   [] Uses home oxygen   [] Productive cough   [] Hemoptysis   [] Wheeze  [] COPD   [] Asthma Neurologic:  [] Dizziness   [] Seizures   [] History of stroke   [] History of TIA  [] Aphasia   [] Vissual changes   [] Weakness or numbness in arm   [] Weakness or numbness in leg Musculoskeletal:   [] Joint swelling   [x] Joint pain   [x] Low back  pain Hematologic:  [x] Easy bruising  [] Easy bleeding   [] Hypercoagulable state   [] Anemic Gastrointestinal:  [] Diarrhea   [] Vomiting  [] Gastroesophageal reflux/heartburn   [] Difficulty swallowing. Genitourinary:  [] Chronic kidney disease   [] Difficult urination  [] Frequent urination   [] Blood in urine Skin:  [] Rashes   [] Ulcers  Psychological:  [] History of anxiety   []  History of major depression.  Physical Examination  Vitals:   07/15/17 1055  BP: 123/71  Pulse: 83  Resp: 14  Weight: 61.7 kg (136 lb)  Height: 5' 5.5" (1.664 m)   Body mass index is 22.29 kg/m.  Gen: WD/WN, NAD Head: [place]/AT, No temporalis wasting.  Ear/Nose/Throat: Hearing grossly intact, nares w/o erythema or drainage Eyes: PER, EOMI, sclera nonicteric.  Neck: Supple, no large masses.   Pulmonary:  Good air movement, no audible wheezing bilaterally, no use of accessory muscles.  Cardiac: RRR, no JVD Vascular: 2-3+ edema of the left leg with severe venous changes of the left leg.  Venous ulcer noted in the ankle area on the left, noninfected Vessel Right Left  Radial Palpable Palpable  PT Trace Palpable Trace Palpable  DP Trace Palpable Trace Palpable  Gastrointestinal: Non-distended. No guarding/no peritoneal signs.  Musculoskeletal: M/S 5/5 throughout.  No deformity or atrophy.  Neurologic: CN 2-12 intact. Symmetrical.  Speech is fluent. Motor exam as listed above. Psychiatric: Judgment intact, Mood & affect appropriate for pt's clinical situation. Dermatologic: Venous stasis dermatitis with ulcers present on the left  No changes consistent with cellulitis. Lymph : No lichenification or skin changes of chronic lymphedema.  CBC Lab Results  Component Value Date   WBC 17.6 (H) 10/29/2015   HGB 11.8 (L) 10/29/2015   HCT 35.7 10/29/2015   MCV 91.4 10/29/2015   PLT 239 10/29/2015    BMET    Component Value Date/Time   NA 140 10/28/2015 0505   K 3.7 10/28/2015 0505   CL 109 10/28/2015 0505   CO2 25  10/28/2015 0505   GLUCOSE 131 (H) 10/28/2015 0505   BUN 13 10/28/2015 0505   CREATININE 1.00 04/16/2016 1319   CALCIUM 8.1 (L) 10/28/2015 0505   GFRNONAA >60 10/28/2015 0505   GFRAA >60 10/28/2015 0505   CrCl cannot be calculated (Patient's most recent lab result is older than the maximum 21 days allowed.).  COAG No results found for: INR, PROTIME  Radiology No results found.  Assessment/Plan 1. Venous ulcer of ankle, left (HCC) No surgery or intervention at this point in time.    I have had a long discussion with the patient regarding venous insufficiency and why it  causes symptoms, specifically venous ulceration . I have discussed with the patient the chronic skin changes that accompany venous insufficiency and the long term sequela such as infection and recurring  ulceration.  Patient will be placed in [employer] which will be changed weekly drainage permitting.  In addition, behavioral modification including several periods of elevation of the lower extremities during the day will be continued. Achieving a position with the ankles at heart level was stressed to the patient  The patient is instructed to begin routine exercise, especially walking on a daily basis  Patient should undergo duplex ultrasound of the venous system to ensure that DVT or reflux is not present.  Following the review of the ultrasound the patient will follow up in one week to reassess the degree of swelling and the control that Unna therapy is offering.   The patient can be assessed for graduated compression stockings or wraps as well as a Lymph Pump once the ulcers are healed.   2. Chronic venous insufficiency No surgery or intervention at this point in time.    I have had a long discussion with the patient regarding venous insufficiency and why it  causes symptoms. I have discussed with the patient the chronic skin changes that accompany venous insufficiency and the long term sequela such as infection  and ulceration.  Patient will begin wearing graduated compression stockings class 1 (20-30 mmHg) or compression wraps on a daily basis a prescription was given. The patient will put the stockings  on first thing in the morning and removing them in the evening. The patient is instructed specifically not to sleep in the stockings.    In addition, behavioral modification including several periods of elevation of the lower extremities during the day will be continued. I have demonstrated that proper elevation is a position with the ankles at heart level.  The patient is instructed to begin routine exercise, especially walking on a daily basis  Following the review of the ultrasound the patient will follow up in 2-3 months to reassess the degree of swelling and the control that graduated compression stockings or compression wraps  is offering.   The patient can be assessed for a Lymph Pump at that time  3. Thoracic aortic aneurysm without rupture [facility]) Continue to follow on an annual basis    Hortencia Pilar, MD  07/16/2017 4:02 PM

## 2017-07-20 DIAGNOSIS — I70219 Atherosclerosis of native arteries of extremities with intermittent claudication, unspecified extremity: Secondary | ICD-10-CM | POA: Insufficient documentation

## 2017-07-20 NOTE — Addendum Note (Signed)
Addended by: Katha Cabal on: 07/20/2017 03:53 PM   Modules accepted: Orders

## 2017-07-20 NOTE — Progress Notes (Signed)
MRN : [id number]  Danielle Williamson is a 83 y.o. (Jan 22, 1932) female who presents with chief complaint of  Chief Complaint  Patient presents with  . Follow-up    Right leg pain with a knot  .  History of Present Illness:The patient returns to the office for followup and review of the noninvasive studies. There has been a significant deterioration in the lower extremity symptoms.  The patient notes interval shortening of their claudication distance and development of mild rest pain symptoms. No new ulcers or wounds have occurred since the last visit.  There have been no significant changes to the patient's overall health care.  The patient denies amaurosis fugax or recent TIA symptoms. There are no recent neurological changes noted. The patient denies history of DVT, PE or superficial thrombophlebitis. The patient denies recent episodes of angina or shortness of breath.     Current Meds  Medication Sig  . albuterol (PROVENTIL HFA;VENTOLIN HFA) 108 (90 Base) MCG/ACT inhaler Inhale 2 puffs into the lungs every 6 (six) hours as needed for wheezing.  Marland Kitchen albuterol (PROVENTIL) (2.5 MG/3ML) 0.083% nebulizer solution Take 2.5 mg by nebulization every 6 (six) hours as needed for wheezing.  Marland Kitchen ALPRAZolam (XANAX) 0.5 MG tablet   . amLODipine (NORVASC) 5 MG tablet Take 1 tablet by mouth daily.  Marland Kitchen atorvastatin (LIPITOR) 20 MG tablet Take 1 tablet by mouth daily.  . bisacodyl (DULCOLAX) 10 MG suppository Place 1 suppository (10 mg total) rectally daily.  . Calcium Carb-Cholecalciferol (CALCIUM-VITAMIN D) 500-200 MG-UNIT tablet Take by mouth.  . chlorpheniramine-HYDROcodone (TUSSIONEX) 10-8 MG/5ML SUER Take 5 mLs by mouth every 12 (twelve) hours as needed for cough.  . Cholecalciferol (VITAMIN D3) 5000 units TABS Take 1 tablet by mouth daily.  Marland Kitchen docusate sodium (COLACE) 100 MG capsule Take 1 capsule (100 mg total) by mouth 2 (two) times daily.  . fluticasone (FLOVENT HFA) 220 MCG/ACT inhaler Inhale 1  puff into the lungs 2 (two) times daily.  . furosemide (LASIX) 20 MG tablet Take 1 tablet by mouth daily.  Marland Kitchen guaiFENesin (MUCINEX) 600 MG 12 hr tablet Take 1 tablet (600 mg total) by mouth 2 (two) times daily.  . hydrocortisone 2.5 % cream Apply 1 application topically 2 (two) times daily.  Marland Kitchen ketoconazole (NIZORAL) 2 % shampoo Apply 1 application topically 2 (two) times a week.  . levothyroxine (SYNTHROID, LEVOTHROID) 88 MCG tablet   . mupirocin ointment (BACTROBAN) 2 % Apply two times a day for 7 days.  . potassium chloride SA (K-DUR,KLOR-CON) 20 MEQ tablet Take 1 tablet by mouth daily.  . predniSONE (DELTASONE) 5 MG tablet Take 1 tablet by mouth daily.  . Probiotic Product (PROBIOTIC-10) CAPS Take by mouth.  . sertraline (ZOLOFT) 50 MG tablet   . SPIRIVA RESPIMAT 2.5 MCG/ACT AERS Take 2 puffs by mouth daily.  . SYMBICORT 160-4.5 MCG/ACT inhaler   . torsemide (DEMADEX) 20 MG tablet Take by mouth.  . triamcinolone cream (KENALOG) 0.1 %   . [DISCONTINUED] ALPRAZolam (XANAX) 0.25 MG tablet Take 0.25 mg by mouth 3 (three) times daily as needed for anxiety.    Past Medical History:  Diagnosis Date  . Allergy   . Aneurysm ([place])    thoracic aortic aneurysm  . Aneurysm, thoracic aortic (HCC) Abdominal Aneurysm  . Anxiety   . Aortic insufficiency   . Arthritis   . Asthma   . CAD (coronary artery disease)    no h/o stents or CABG  . Cataract Bilateral Eyes  .  Constipation   . COPD (chronic obstructive pulmonary disease) (HCC)    on nocturnal o2  . Diastolic CHF ([place])   . History of adenomatous polyp of colon   . History of uterine cancer   . Hyperlipidemia   . Hypertension   . Pulmonary emphysema ([place])   . Skin cancer   . Thyroid disease    hypo  . UTI (lower urinary tract infection)     Past Surgical History:  Procedure Laterality Date  . ABDOMINAL HYSTERECTOMY    . BREAST CYST ASPIRATION Left 1985   neg  . CHOLECYSTECTOMY    . SHOULDER ARTHROSCOPY W/ CAPSULAR REPAIR      Left  . TOE SURGERY     Right toe surgery    Social History Social History  Substance Use Topics  . Smoking status: Former Smoker    Years: 20.00  . Smokeless tobacco: Never Used     Comment: Quit 2 years ago.   . Alcohol use 0.0 oz/week     Comment: rarely    Family History Family History  Problem Relation Age of Onset  . Breast cancer Cousin   . Heart attack Mother   . Hypertension Mother   . Bladder Cancer Father   . Prostate cancer Father   . Diabetes Sister   . Prostate cancer Brother   . AAA (abdominal aortic aneurysm) Brother     Allergies  Allergen Reactions  . Epinephrine   . Furosemide     Other reaction(s): Other (See Comments)  . Lopid [Gemfibrozil] Other (See Comments)    Other reaction(s): Muscle Pain Reaction: Muscle pain  . Lovastatin Other (See Comments)    Other reaction(s): Muscle Pain Reaction: Muscle pain  . Neosporin [Neomycin-Bacitracin Zn-Polymyx]   . Polysporin [Bacitracin-Polymyxin B]   . Sertraline Nausea Only  . Iodine Rash    Hives per patient (10/27/15)   . Moxifloxacin Palpitations  . Penicillin V Potassium Rash  . Penicillins Rash  . Sulfa Antibiotics Rash  . Vesicare [Solifenacin] Other (See Comments) and Palpitations    Reaction: Palpations     REVIEW OF SYSTEMS (Negative unless checked)  Constitutional: [] Weight loss  [] Fever  [] Chills Cardiac: [] Chest pain   [] Chest pressure   [] Palpitations   [] Shortness of breath when laying flat   [] Shortness of breath with exertion. Vascular:  [] Pain in legs with walking   [] Pain in legs at rest  [] History of DVT   [] Phlebitis   [] Swelling in legs   [] Varicose veins   [] Non-healing ulcers Pulmonary:   [] Uses home oxygen   [] Productive cough   [] Hemoptysis   [] Wheeze  [] COPD   [] Asthma Neurologic:  [] Dizziness   [] Seizures   [] History of stroke   [] History of TIA  [] Aphasia   [] Vissual changes   [] Weakness or numbness in arm   [] Weakness or numbness in leg Musculoskeletal:   [] Joint  swelling   [] Joint pain   [] Low back pain Hematologic:  [] Easy bruising  [] Easy bleeding   [] Hypercoagulable state   [] Anemic Gastrointestinal:  [] Diarrhea   [] Vomiting  [] Gastroesophageal reflux/heartburn   [] Difficulty swallowing. Genitourinary:  [] Chronic kidney disease   [] Difficult urination  [] Frequent urination   [] Blood in urine Skin:  [] Rashes   [] Ulcers  Psychological:  [] History of anxiety   []  History of major depression.  Physical Examination  Vitals:   07/04/17 1420  BP: 136/77  Pulse: 85  Resp: 16  Weight: 61.2 kg (135 lb)  Height: 5\' 2"  (1.575 m)  Body mass index is 24.69 kg/m. Gen: WD/WN, NAD Head: [place]/AT, No temporalis wasting.  Ear/Nose/Throat: Hearing grossly intact, nares w/o erythema or drainage Eyes: PER, EOMI, sclera nonicteric.  Neck: Supple, no large masses.   Pulmonary:  Good air movement, no audible wheezing bilaterally, no use of accessory muscles.  Cardiac: RRR, no JVD Vascular:  Vessel Right Left  Radial Palpable Palpable  PT Not Palpable Not Palpable  DP Not Palpable Not Palpable  Gastrointestinal: Non-distended. No guarding/no peritoneal signs.  Musculoskeletal: M/S 5/5 throughout.  No deformity or atrophy.  Neurologic: CN 2-12 intact. Symmetrical.  Speech is fluent. Motor exam as listed above. Psychiatric: Judgment intact, Mood & affect appropriate for pt's clinical situation. Dermatologic: No rashes or ulcers noted.  No changes consistent with cellulitis. Lymph : No lichenification or skin changes of chronic lymphedema.  CBC Lab Results  Component Value Date   WBC 17.6 (H) 10/29/2015   HGB 11.8 (L) 10/29/2015   HCT 35.7 10/29/2015   MCV 91.4 10/29/2015   PLT 239 10/29/2015    BMET    Component Value Date/Time   NA 140 10/28/2015 0505   K 3.7 10/28/2015 0505   CL 109 10/28/2015 0505   CO2 25 10/28/2015 0505   GLUCOSE 131 (H) 10/28/2015 0505   BUN 13 10/28/2015 0505   CREATININE 1.00 04/16/2016 1319   CALCIUM 8.1 (L)  10/28/2015 0505   GFRNONAA >60 10/28/2015 0505   GFRAA >60 10/28/2015 0505   CrCl cannot be calculated (Patient's most recent lab result is older than the maximum 21 days allowed.).  COAG No results found for: INR, PROTIME  Radiology No results found.  Assessment/Plan 1. Thoracic aortic aneurysm without rupture ([place]) Continue to follow with CT   2. Atherosclerosis of native artery of both lower extremities with rest pain ([place]) Recommend:  Patient should undergo arterial duplex of the lower extremity ASAP because there has been a significant deterioration in the patient's lower extremity symptoms.  The patient states they are having increased pain and a marked decrease in the distance that they can walk.  The risks and benefits as well as the alternatives were discussed in detail with the patient.  All questions were answered.  Patient agrees to proceed and understands this could be a prelude to angiography and intervention.  The patient will follow up with me in the office to review the studies.   - VAS [place] ABI WITH/WO TBI; Future - VAS [place] LOWER EXTREMITY ARTERIAL DUPLEX; Future    Hortencia Pilar, MD  07/20/2017 3:51 PM

## 2017-07-22 ENCOUNTER — Ambulatory Visit (INDEPENDENT_AMBULATORY_CARE_PROVIDER_SITE_OTHER): Payer: Medicare Other | Admitting: Vascular Surgery

## 2017-07-22 ENCOUNTER — Encounter (INDEPENDENT_AMBULATORY_CARE_PROVIDER_SITE_OTHER): Payer: Self-pay

## 2017-07-22 VITALS — BP 138/83 | HR 91 | Resp 16

## 2017-07-22 DIAGNOSIS — I70223 Atherosclerosis of native arteries of extremities with rest pain, bilateral legs: Secondary | ICD-10-CM | POA: Diagnosis not present

## 2017-07-22 DIAGNOSIS — I872 Venous insufficiency (chronic) (peripheral): Secondary | ICD-10-CM

## 2017-07-22 NOTE — Progress Notes (Signed)
History of Present Illness  There is no documented history at this time  Assessments & Plan   There are no diagnoses linked to this encounter.    Additional instructions  Subjective:  Patient presents with venous ulcer of the Left lower extremity.    Procedure:  3 layer unna wrap was placed Left lower extremity.   Plan:   Follow up in one week.  

## 2017-07-29 ENCOUNTER — Encounter (INDEPENDENT_AMBULATORY_CARE_PROVIDER_SITE_OTHER): Payer: Self-pay | Admitting: Vascular Surgery

## 2017-07-29 ENCOUNTER — Ambulatory Visit (INDEPENDENT_AMBULATORY_CARE_PROVIDER_SITE_OTHER): Payer: Medicare Other | Admitting: Vascular Surgery

## 2017-07-29 VITALS — BP 119/68 | HR 70 | Resp 15 | Ht 63.0 in | Wt 132.0 lb

## 2017-07-29 DIAGNOSIS — I872 Venous insufficiency (chronic) (peripheral): Secondary | ICD-10-CM | POA: Diagnosis not present

## 2017-07-29 NOTE — Progress Notes (Signed)
History of Present Illness  There is no documented history at this time  Assessments & Plan   There are no diagnoses linked to this encounter.    Additional instructions  Subjective:  Patient presents with venous ulcer of the Right lower extremity.    Procedure:  3 layer unna wrap was placed Right lower extremity.   Plan:   Follow up in one week.   

## 2017-08-01 IMAGING — CT CT CTA ABD/PEL W/CM AND/OR W/O CM
2 of 6 series · 14 of 46 positions shown, 16 images · IV contrast (omnipaque)
Comparison: Chest CT 01/22/2014

CLINICAL DATA: Chest pain. Fever and generalized body aches for 2
days.

EXAM:
CT ANGIOGRAPHY CHEST, ABDOMEN AND PELVIS
TECHNIQUE: Multidetector CT imaging through the chest, abdomen and pelvis was
performed using the standard protocol during bolus administration of
intravenous contrast. Multiplanar reconstructed images and MIPs were
obtained and reviewed to evaluate the vascular anatomy.
CONTRAST:  100mL OMNIPAQUE IOHEXOL 350 MG/ML SOLN

[Series 6: arterial · axial · arterial · 0.60mm/px · z∈[-356,+164]mm · 11 of 308 slices shown, 13 images]
[im 24/308  soft-tissue]
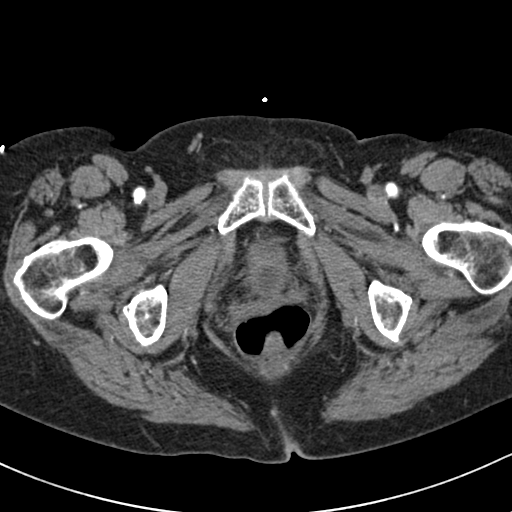
[im 24/308  bone]
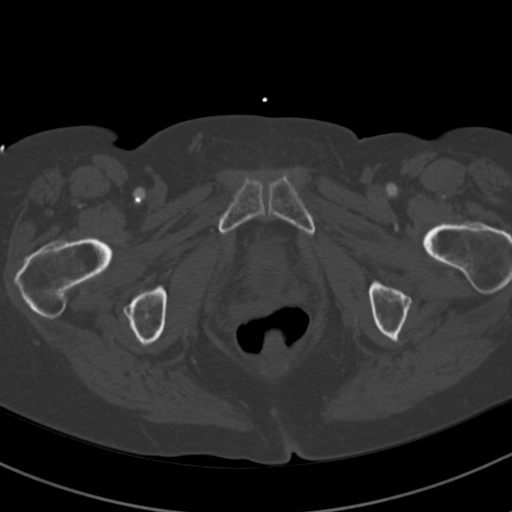
[im 48/308  soft-tissue]
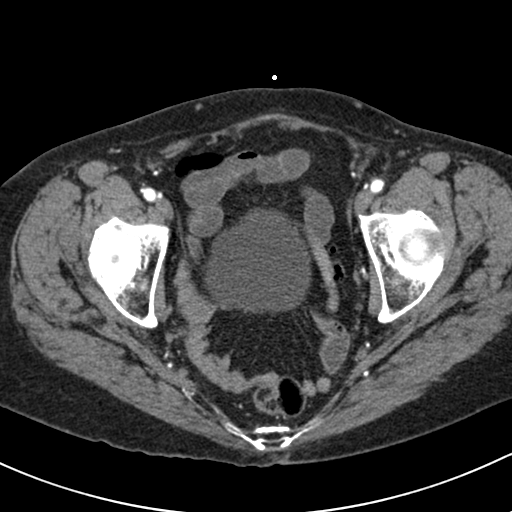
[im 71/308  soft-tissue]
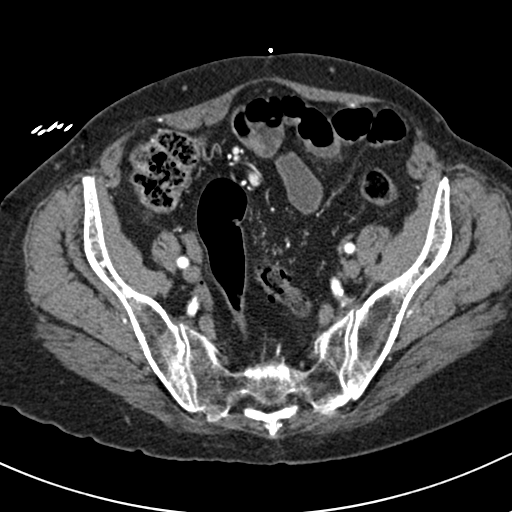
[im 95/308  soft-tissue]
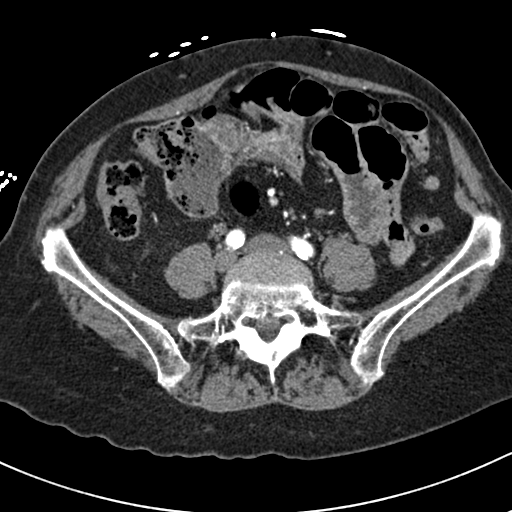
[im 130/308  soft-tissue]
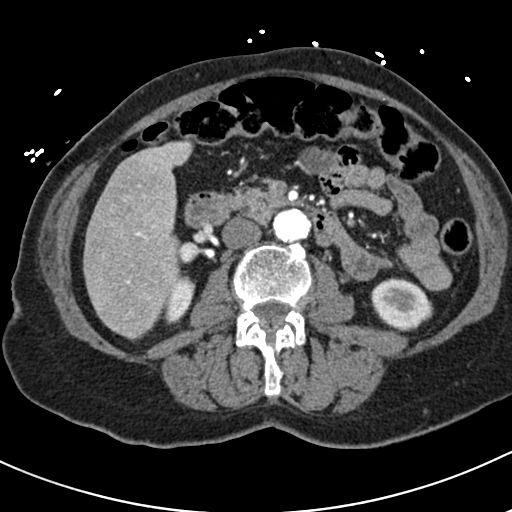
[im 154/308  soft-tissue]
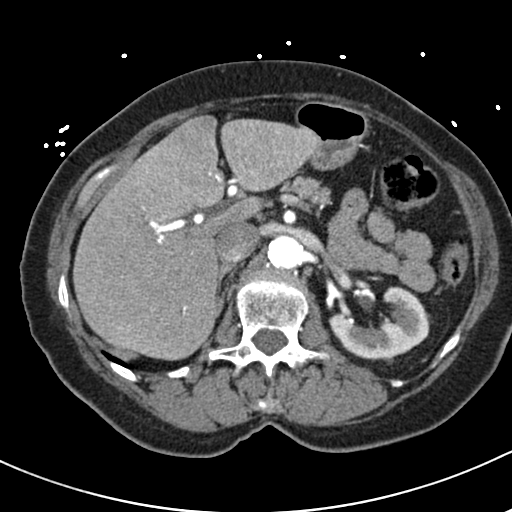
[im 178/308  soft-tissue]
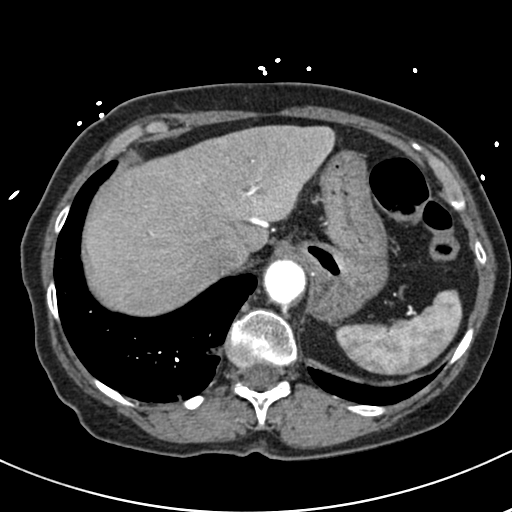
[im 213/308  soft-tissue]
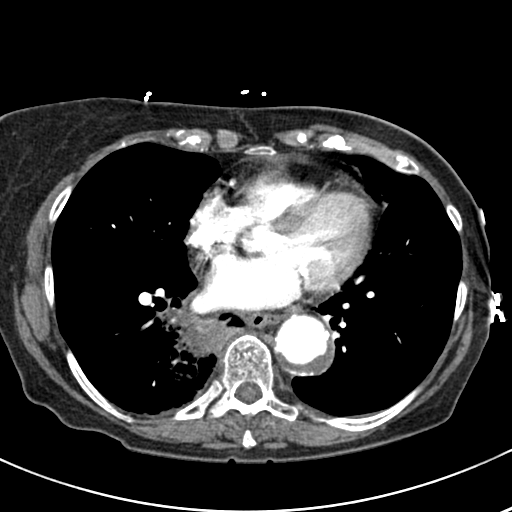
[im 237/308  soft-tissue]
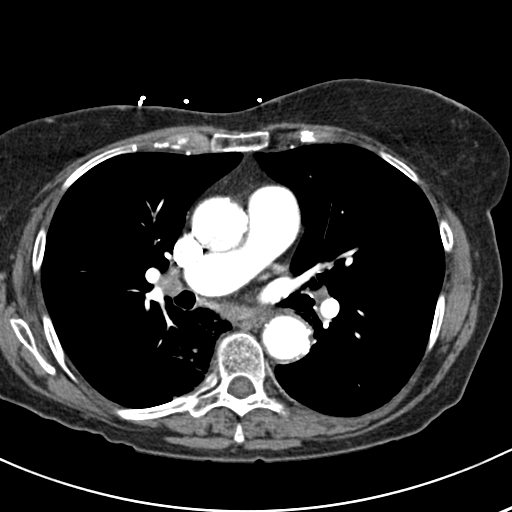
[im 237/308  bone]
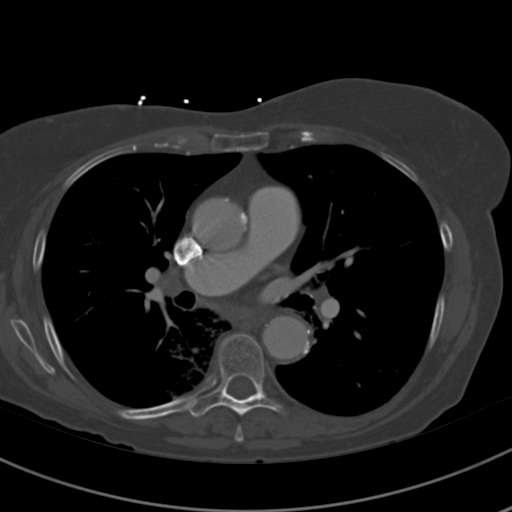
[im 260/308  soft-tissue]
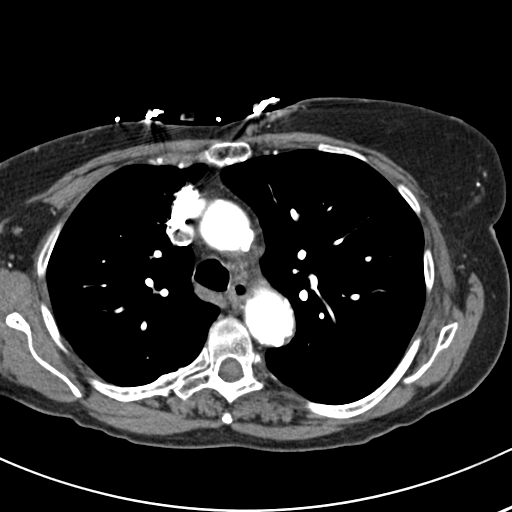
[im 284/308  soft-tissue]
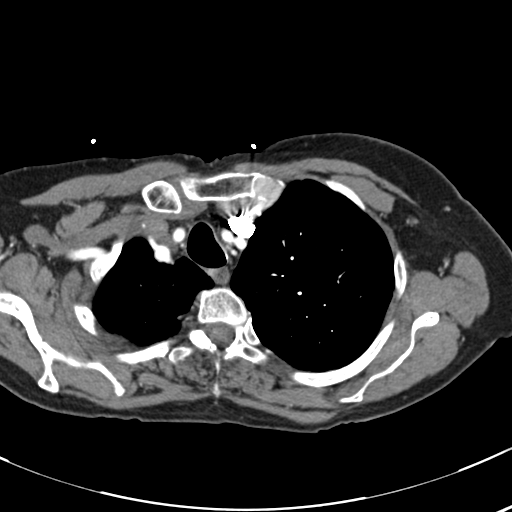

[Series 8: cor arterial mpr · coronal · arterial · 0.68mm/px · 3 of 124 slices shown]
[im 31/124  soft-tissue]
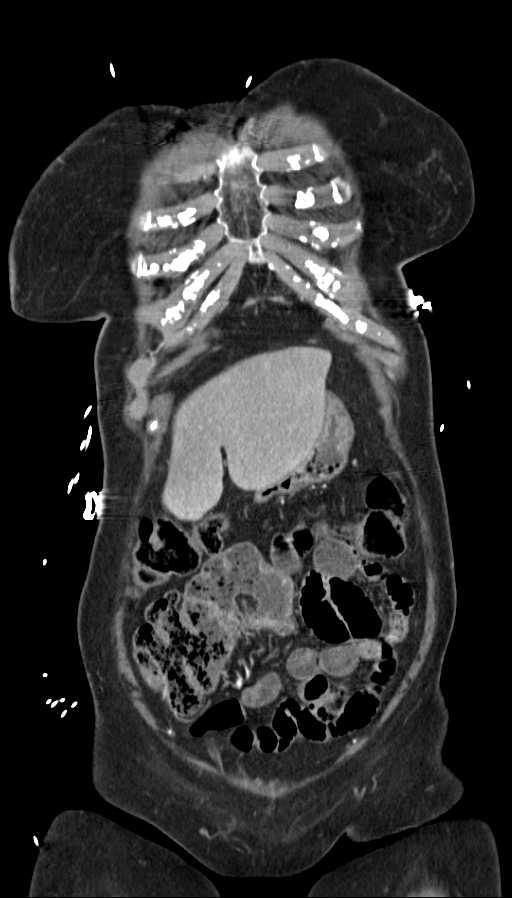
[im 62/124  soft-tissue]
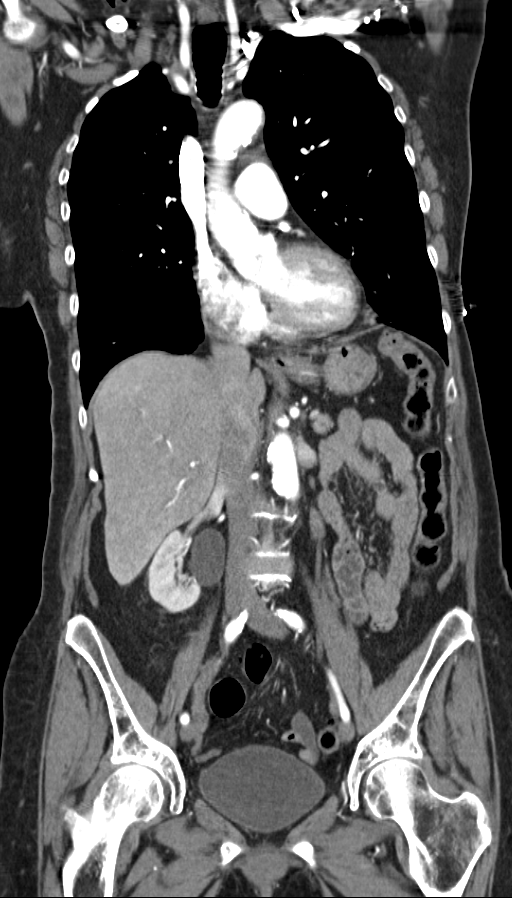
[im 93/124  soft-tissue]
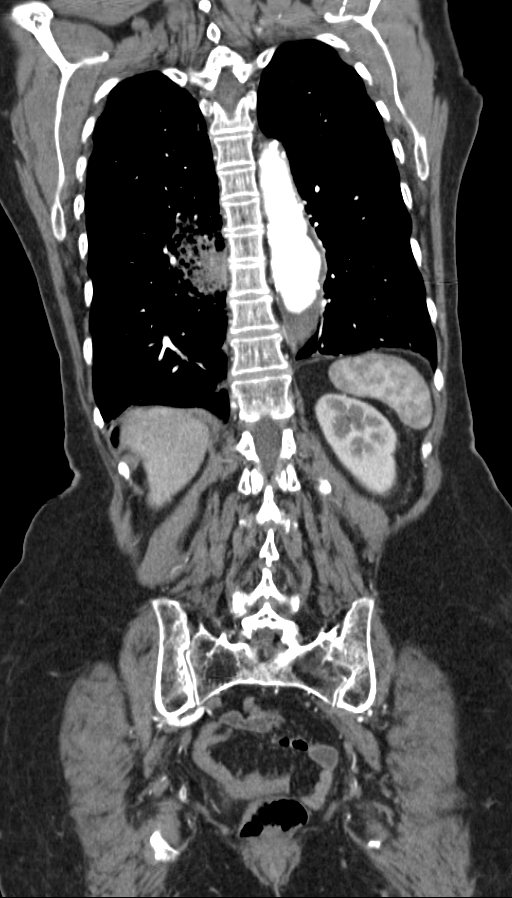

[14 of 46 positions shown; findings below may reference images not displayed]

FINDINGS: CTA CHEST FINDINGS

THORACIC INLET/BODY WALL:

No acute abnormality.

MEDIASTINUM:

Normal heart size and no pericardial effusion. Mild anterior
pericardial thickening is stable. Moderate aortic valve
calcifications/ sclerosis. Extensive atherosclerosis, including the
coronary arteries. A fusiform distal thoracic aorta aneurysm with
mural thrombus measures 39 mm maximal outer wall diameter, increased
by 2 mm compared to prior. There is no evidence of acute aortic
syndrome including rupture, dissection, or intramural hematoma. No
plaque ulceration. No evidence of pulmonary embolism.

LUNG WINDOWS:

Dense consolidation in the superior and medial right lower lobe.
There is broad pleural contact which could explain history of chest
pain. Calcified granulomas in the right lung. No suspicious
pulmonary nodule. No pleural effusion or cavitation.

OSSEOUS:

No acute fracture.  No suspicious lytic or blastic lesions.

Review of the MIP images confirms the above findings.

CTA ABDOMEN AND PELVIS FINDINGS

Hepatobiliary: Heterogeneous liver density which is likely from
contrast timing. There are subtle hypervascular areas, pattern also
seen on comparison study and likely small shunts.Cholecystectomy.

Pancreas: Coarse calcifications in the uncinate, nonspecific. No
duct enlargement or active inflammation.

Spleen: Unremarkable.

Adrenals/Urinary Tract: Negative adrenals. No hydronephrosis or
stone. Unremarkable bladder.

Reproductive:Hysterectomy with negative adnexa

Stomach/Bowel:  No obstruction. No appendicitis.

Vascular/Lymphatic: Right hepatic artery is replaced to the SMA.
Otherwise, abdominal aortic branching is standard. Diffuse
atheromatous wall calcification and thickening. No aneurysm or major
branch occlusion. No dissection or inflammatory wall thickening. No
mass or adenopathy.

Peritoneal: No ascites or pneumoperitoneum.

Musculoskeletal: No acute abnormalities.

Review of the MIP images confirms the above findings.
IMPRESSION: 1. Right lower lobe pneumonia. The opacity is subtle by chest x-ray
but visible laterally. Followup PA and lateral chest X-ray is
recommended in 3-4 weeks following trial of antibiotic therapy to
ensure resolution.
2. No acute aortic finding.
3. Fusiform descending thoracic aortic aneurysm measures 39 mm in
diameter, 2 mm growth since January 2014.

## 2017-08-05 ENCOUNTER — Ambulatory Visit (INDEPENDENT_AMBULATORY_CARE_PROVIDER_SITE_OTHER): Payer: Medicare Other | Admitting: Vascular Surgery

## 2017-08-05 DIAGNOSIS — I872 Venous insufficiency (chronic) (peripheral): Secondary | ICD-10-CM

## 2017-08-05 DIAGNOSIS — L97329 Non-pressure chronic ulcer of left ankle with unspecified severity: Secondary | ICD-10-CM

## 2017-08-05 DIAGNOSIS — I890 Lymphedema, not elsewhere classified: Secondary | ICD-10-CM | POA: Insufficient documentation

## 2017-08-05 DIAGNOSIS — I83023 Varicose veins of left lower extremity with ulcer of ankle: Secondary | ICD-10-CM

## 2017-08-05 DIAGNOSIS — I70223 Atherosclerosis of native arteries of extremities with rest pain, bilateral legs: Secondary | ICD-10-CM | POA: Diagnosis not present

## 2017-08-05 NOTE — Progress Notes (Signed)
Subjective:    Patient ID: Danielle Williamson, female    DOB: [birth year], 83 y.o.   MRN: [id number] Chief Complaint  Patient presents with  . Follow-up    Unna check   Patient presents for a monthly unna boot therapy follow-up. The patient has been treated with a left lower extremity unna wrap for a medial ankle ulceration. Patient seen with daughter. Patient states minimal improvement to the ulceration. Patient has been elevating heart level or higher as much as possible. Patient continues to wear medical grade 1 compression stockings to the right lower extremity. Patient does experience some pain from the ulcer site. Patient denies any fever, nausea or vomiting.   Review of Systems  Constitutional: Negative.   HENT: Negative.   Eyes: Negative.   Respiratory: Negative.   Cardiovascular: Negative.   Gastrointestinal: Negative.   Endocrine: Negative.   Genitourinary: Negative.   Musculoskeletal: Negative.   Skin: Positive for wound.  Allergic/Immunologic: Negative.   Neurological: Negative.   Hematological: Negative.   Psychiatric/Behavioral: Negative.       Objective:   Physical Exam  Constitutional: She is oriented to person, place, and time. She appears well-developed and well-nourished. No distress.  HENT:  Head: Normocephalic and atraumatic.  Eyes: Pupils are equal, round, and reactive to light. Conjunctivae are normal.  Neck: Normal range of motion.  Cardiovascular: Normal rate, regular rhythm, normal heart sounds and intact distal pulses.   Pulses:      Radial pulses are 2+ on the right side, and 2+ on the left side.       Dorsalis pedis pulses are 2+ on the right side, and 2+ on the left side.       Posterior tibial pulses are 2+ on the right side, and 2+ on the left side.  Pulmonary/Chest: Effort normal.  Musculoskeletal: Normal range of motion. She exhibits no edema (No edema noted to the bilateral lower extremity).  Neurological: She is alert and oriented to person,  place, and time.  Skin: She is not diaphoretic.     1 cm x 1 cm extremely superficial ulceration noted to the medial left ankle. About dime size. No drainage. No cellulitis. Noninfected.  Psychiatric: She has a normal mood and affect. Her behavior is normal. Judgment and thought content normal.  Vitals reviewed.  There were no vitals taken for this visit.  Past Medical History:  Diagnosis Date  . Allergy   . Aneurysm ([place])    thoracic aortic aneurysm  . Aneurysm, thoracic aortic ([facility]) Abdominal Aneurysm  . Anxiety   . Aortic insufficiency   . Arthritis   . Asthma   . CAD (coronary artery disease)    no h/o stents or CABG  . Cataract Bilateral Eyes  . Constipation   . COPD (chronic obstructive pulmonary disease) (HCC)    on nocturnal o2  . Diastolic CHF ([place])   . History of adenomatous polyp of colon   . History of uterine cancer   . Hyperlipidemia   . Hypertension   . Pulmonary emphysema ([place])   . Skin cancer   . Thyroid disease    hypo  . UTI (lower urinary tract infection)    Social History   Social History  . Marital status: Married    Spouse name: N/A  . Number of children: N/A  . Years of education: N/A   Occupational History  . Not on file.   Social History Main Topics  . Smoking status: Former Smoker  Years: 20.00  . Smokeless tobacco: Never Used     Comment: Quit 2 years ago.   . Alcohol use 0.0 oz/week     Comment: rarely  . Drug use: No  . Sexual activity: Not on file   Other Topics Concern  . Not on file   Social History Narrative   Lives at home independently. Sister lives next door.   Past Surgical History:  Procedure Laterality Date  . ABDOMINAL HYSTERECTOMY    . BREAST CYST ASPIRATION Left 1985   neg  . CHOLECYSTECTOMY    . SHOULDER ARTHROSCOPY W/ CAPSULAR REPAIR     Left  . TOE SURGERY     Right toe surgery   Family History  Problem Relation Age of Onset  . Breast cancer Cousin   . Heart attack Mother   . Hypertension  Mother   . Bladder Cancer Father   . Prostate cancer Father   . Diabetes Sister   . Prostate cancer Brother   . AAA (abdominal aortic aneurysm) Brother    Allergies  Allergen Reactions  . Epinephrine   . Furosemide     Other reaction(s): Other (See Comments)  . Lopid [Gemfibrozil] Other (See Comments)    Other reaction(s): Muscle Pain Reaction: Muscle pain  . Lovastatin Other (See Comments)    Other reaction(s): Muscle Pain Reaction: Muscle pain  . Neosporin [Neomycin-Bacitracin Zn-Polymyx]   . Polysporin [Bacitracin-Polymyxin B]   . Sertraline Nausea Only  . Iodine Rash    Hives per patient (10/27/15)   . Moxifloxacin Palpitations  . Penicillin V Potassium Rash  . Penicillins Rash  . Sulfa Antibiotics Rash  . Vesicare [Solifenacin] Other (See Comments) and Palpitations    Reaction: Palpations      Assessment & Plan:  Patient presents for a monthly unna boot therapy follow-up. The patient has been treated with a left lower extremity unna wrap for a medial ankle ulceration. Patient seen with daughter. Patient states minimal improvement to the ulceration. Patient has been elevating heart level or higher as much as possible. Patient continues to wear medical grade 1 compression stockings to the right lower extremity. Patient does experience some pain from the ulcer site. Patient denies any fever, nausea or vomiting.  1. Venous ulcer of ankle, left (HCC) - Stable There is some improvement to the left medial ankle ulceration however it is not completely healed Recommend another 4 weeks of Unna wrap therapy to the left lower extremity Despite conservative treatment including exercise, elevation and 3 layer Unna wrap therapy the patient still presents with stage I lymphedema with a left lower venous ulcer which is slow to heal. The patient would greatly benefit from the added therapy of a lymphedema pump Will apply Patient to follow up in 1 month to assess wound healing  2.  Lymphedema - stable As above  3. Chronic venous insufficiency - stable As above  Current Outpatient Prescriptions on File Prior to Visit  Medication Sig Dispense Refill  . albuterol (PROVENTIL HFA;VENTOLIN HFA) 108 (90 Base) MCG/ACT inhaler Inhale 2 puffs into the lungs every 6 (six) hours as needed for wheezing.    Marland Kitchen albuterol (PROVENTIL) (2.5 MG/3ML) 0.083% nebulizer solution Take 2.5 mg by nebulization every 6 (six) hours as needed for wheezing.    Marland Kitchen ALPRAZolam (XANAX) 0.5 MG tablet   1  . amLODipine (NORVASC) 5 MG tablet Take 1 tablet by mouth daily.  4  . atorvastatin (LIPITOR) 20 MG tablet Take 1 tablet by mouth daily.  2  . bisacodyl (DULCOLAX) 10 MG suppository Place 1 suppository (10 mg total) rectally daily. 12 suppository 0  . Calcium Carb-Cholecalciferol (CALCIUM-VITAMIN D) 500-200 MG-UNIT tablet Take by mouth.    . chlorpheniramine-HYDROcodone (TUSSIONEX) 10-8 MG/5ML SUER Take 5 mLs by mouth every 12 (twelve) hours as needed for cough. 140 mL 0  . Cholecalciferol (VITAMIN D3) 5000 units TABS Take 1 tablet by mouth daily.    Marland Kitchen docusate sodium (COLACE) 100 MG capsule Take 1 capsule (100 mg total) by mouth 2 (two) times daily. 10 capsule 0  . fluticasone (FLOVENT HFA) 220 MCG/ACT inhaler Inhale 1 puff into the lungs 2 (two) times daily.    . furosemide (LASIX) 20 MG tablet Take 1 tablet by mouth daily.  5  . guaiFENesin (MUCINEX) 600 MG 12 hr tablet Take 1 tablet (600 mg total) by mouth 2 (two) times daily. 20 tablet 2  . hydrocortisone 2.5 % cream Apply 1 application topically 2 (two) times daily.    Marland Kitchen ketoconazole (NIZORAL) 2 % shampoo Apply 1 application topically 2 (two) times a week.    . levothyroxine (SYNTHROID, LEVOTHROID) 88 MCG tablet   10  . meloxicam (MOBIC) 7.5 MG tablet   0  . metoprolol succinate (TOPROL-XL) 25 MG 24 hr tablet Take by mouth.    . mupirocin ointment (BACTROBAN) 2 % Apply two times a day for 7 days. 22 g 0  . potassium chloride SA (K-DUR,KLOR-CON) 20  MEQ tablet Take 1 tablet by mouth daily.  5  . predniSONE (DELTASONE) 5 MG tablet Take 1 tablet by mouth daily.  1  . Probiotic Product (PROBIOTIC-10) CAPS Take by mouth.    . sertraline (ZOLOFT) 50 MG tablet   10  . SPIRIVA RESPIMAT 2.5 MCG/ACT AERS Take 2 puffs by mouth daily.  10  . SYMBICORT 160-4.5 MCG/ACT inhaler   11  . torsemide (DEMADEX) 20 MG tablet Take by mouth.    . triamcinolone cream (KENALOG) 0.1 %   0   No current facility-administered medications on file prior to visit.    There are no Patient Instructions on file for this visit. No Follow-up on file.  Danielle Williamson A Dayyan Krist, PA-C

## 2017-08-12 ENCOUNTER — Ambulatory Visit (INDEPENDENT_AMBULATORY_CARE_PROVIDER_SITE_OTHER): Payer: Medicare Other | Admitting: Vascular Surgery

## 2017-08-12 ENCOUNTER — Encounter (INDEPENDENT_AMBULATORY_CARE_PROVIDER_SITE_OTHER): Payer: Self-pay

## 2017-08-12 ENCOUNTER — Encounter (INDEPENDENT_AMBULATORY_CARE_PROVIDER_SITE_OTHER): Payer: Medicare Other

## 2017-08-12 VITALS — BP 133/73 | HR 94 | Resp 17

## 2017-08-12 DIAGNOSIS — I890 Lymphedema, not elsewhere classified: Secondary | ICD-10-CM

## 2017-08-12 NOTE — Progress Notes (Signed)
History of Present Illness  There is no documented history at this time  Assessments & Plan   There are no diagnoses linked to this encounter.    Additional instructions  Subjective:  Patient presents with venous ulcer of the Left lower extremity.    Procedure:  3 layer unna wrap was placed Left lower extremity.   Plan:   Follow up in one week.  

## 2017-08-19 ENCOUNTER — Ambulatory Visit (INDEPENDENT_AMBULATORY_CARE_PROVIDER_SITE_OTHER): Payer: Medicare Other | Admitting: Vascular Surgery

## 2017-08-19 ENCOUNTER — Encounter (INDEPENDENT_AMBULATORY_CARE_PROVIDER_SITE_OTHER): Payer: Self-pay

## 2017-08-19 VITALS — BP 135/69 | HR 75 | Resp 16

## 2017-08-19 DIAGNOSIS — I83023 Varicose veins of left lower extremity with ulcer of ankle: Secondary | ICD-10-CM | POA: Diagnosis not present

## 2017-08-19 DIAGNOSIS — L97329 Non-pressure chronic ulcer of left ankle with unspecified severity: Secondary | ICD-10-CM

## 2017-08-19 NOTE — Progress Notes (Signed)
History of Present Illness  There is no documented history at this time  Assessments & Plan   There are no diagnoses linked to this encounter.    Additional instructions  Subjective:  Patient presents with venous ulcer of the Left lower extremity.    Procedure:  3 layer unna wrap was placed Left lower extremity.   Plan:   Follow up in one week.  

## 2017-08-26 ENCOUNTER — Encounter (INDEPENDENT_AMBULATORY_CARE_PROVIDER_SITE_OTHER): Payer: Self-pay

## 2017-08-26 ENCOUNTER — Ambulatory Visit (INDEPENDENT_AMBULATORY_CARE_PROVIDER_SITE_OTHER): Payer: Medicare Other | Admitting: Vascular Surgery

## 2017-08-26 VITALS — BP 132/77 | HR 90 | Resp 16

## 2017-08-26 DIAGNOSIS — I890 Lymphedema, not elsewhere classified: Secondary | ICD-10-CM | POA: Diagnosis not present

## 2017-08-26 NOTE — Progress Notes (Signed)
History of Present Illness  There is no documented history at this time  Assessments & Plan   There are no diagnoses linked to this encounter.    Additional instructions  Subjective:  Patient presents with venous ulcer of the Left lower extremity.    Procedure:  3 layer unna wrap was placed Left lower extremity.   Plan:   Follow up in one week.  

## 2017-09-02 ENCOUNTER — Encounter (INDEPENDENT_AMBULATORY_CARE_PROVIDER_SITE_OTHER): Payer: Self-pay | Admitting: Vascular Surgery

## 2017-09-02 ENCOUNTER — Ambulatory Visit (INDEPENDENT_AMBULATORY_CARE_PROVIDER_SITE_OTHER): Payer: Medicare Other | Admitting: Vascular Surgery

## 2017-09-02 VITALS — BP 128/76 | HR 70 | Resp 16 | Wt 132.4 lb

## 2017-09-02 DIAGNOSIS — I890 Lymphedema, not elsewhere classified: Secondary | ICD-10-CM

## 2017-09-02 DIAGNOSIS — I70223 Atherosclerosis of native arteries of extremities with rest pain, bilateral legs: Secondary | ICD-10-CM | POA: Diagnosis not present

## 2017-09-02 DIAGNOSIS — L97329 Non-pressure chronic ulcer of left ankle with unspecified severity: Secondary | ICD-10-CM | POA: Diagnosis not present

## 2017-09-02 DIAGNOSIS — I872 Venous insufficiency (chronic) (peripheral): Secondary | ICD-10-CM

## 2017-09-02 DIAGNOSIS — I83023 Varicose veins of left lower extremity with ulcer of ankle: Secondary | ICD-10-CM

## 2017-09-02 NOTE — Progress Notes (Signed)
Subjective:    Patient ID: Danielle Williamson, female    DOB: 04-04-1932, 83 y.o.   MRN: [id number] Chief Complaint  Patient presents with  . Follow-up    unna check   Patient presents for a monthly Unna boot therapy follow-up.  The patient presents today without complaint.  The patient has been using her newly received lymphedema pump for approximately 1 week.  She notes some sensitivity to her skin since starting to use her lymphedema pump.  The company representative recommend using the lymphedema pump twice a day for an hour each time.  The patient's ulceration has now healed into a scab.  The patient reports improvement in her lower extremity edema.  Denies any fever, nausea or vomiting.  She was seen with her daughter.   Review of Systems  Constitutional: Negative.   HENT: Negative.   Eyes: Negative.   Respiratory: Negative.   Cardiovascular: Positive for leg swelling.  Gastrointestinal: Negative.   Endocrine: Negative.   Genitourinary: Negative.   Musculoskeletal: Negative.   Skin: Positive for wound.  Allergic/Immunologic: Negative.   Neurological: Negative.   Hematological: Negative.   Psychiatric/Behavioral: Negative.       Objective:   Physical Exam  Constitutional: She is oriented to person, place, and time. She appears well-developed and well-nourished. No distress.  HENT:  Head: Normocephalic and atraumatic.  Eyes: Conjunctivae are normal. Pupils are equal, round, and reactive to light.  Neck: Normal range of motion.  Cardiovascular: Normal rate, regular rhythm, normal heart sounds and intact distal pulses.  Pulmonary/Chest: Effort normal.  Musculoskeletal: Normal range of motion. She exhibits no edema (Minimal amount of edema noted on exam).  Neurological: She is alert and oriented to person, place, and time.  Skin: She is not diaphoretic.  The patients previous ulceration has now healed into a scab  Psychiatric: She has a normal mood and affect. Her behavior is  normal. Judgment and thought content normal.  Vitals reviewed.  BP 128/76 (BP Location: Right Arm)   Pulse 70   Resp 16   Wt 132 lb 6.4 oz (60.1 kg)   BMI 23.45 kg/m   Past Medical History:  Diagnosis Date  . Allergy   . Aneurysm ([place])    thoracic aortic aneurysm  . Aneurysm, thoracic aortic ([facility]CC) Abdominal Aneurysm  . Anxiety   . Aortic insufficiency   . Arthritis   . Asthma   . CAD (coronary artery disease)    no h/o stents or CABG  . Cataract Bilateral Eyes  . Constipation   . COPD (chronic obstructive pulmonary disease) ([facility])    on nocturnal o2  . Diastolic CHF ([place])   . History of adenomatous polyp of colon   . History of uterine cancer   . Hyperlipidemia   . Hypertension   . Pulmonary emphysema ([place])   . Skin cancer   . Thyroid disease    hypo  . UTI (lower urinary tract infection)    Social History   Socioeconomic History  . Marital status: Married    Spouse name: Not on file  . Number of children: Not on file  . Years of education: Not on file  . Highest education level: Not on file  Social Needs  . Financial resource strain: Not on file  . Food insecurity - worry: Not on file  . Food insecurity - inability: Not on file  . Transportation needs - medical: Not on file  . Transportation needs - non-medical: Not on file  Occupational History  . Not on file  Tobacco Use  . Smoking status: Former Smoker    Years: 20.00  . Smokeless tobacco: Never Used  . Tobacco comment: Quit 2 years ago.   Substance and Sexual Activity  . Alcohol use: Yes    Alcohol/week: 0.0 oz    Comment: rarely  . Drug use: No  . Sexual activity: Not on file  Other Topics Concern  . Not on file  Social History Narrative   Lives at home independently. Sister lives next door.   Past Surgical History:  Procedure Laterality Date  . ABDOMINAL HYSTERECTOMY    . BREAST CYST ASPIRATION Left 1985   neg  . CHOLECYSTECTOMY    . SHOULDER ARTHROSCOPY W/ CAPSULAR REPAIR     Left  .  TOE SURGERY     Right toe surgery   Family History  Problem Relation Age of Onset  . Breast cancer Cousin   . Heart attack Mother   . Hypertension Mother   . Bladder Cancer Father   . Prostate cancer Father   . Diabetes Sister   . Prostate cancer Brother   . AAA (abdominal aortic aneurysm) Brother    Allergies  Allergen Reactions  . Epinephrine   . Furosemide     Other reaction(s): Other (See Comments)  . Lopid [Gemfibrozil] Other (See Comments)    Other reaction(s): Muscle Pain Reaction: Muscle pain  . Lovastatin Other (See Comments)    Other reaction(s): Muscle Pain Reaction: Muscle pain  . Neosporin [Neomycin-Bacitracin Zn-Polymyx]   . Polysporin [Bacitracin-Polymyxin B]   . Sertraline Nausea Only  . Iodine Rash    Hives per patient (10/27/15)   . Moxifloxacin Palpitations  . Penicillin V Potassium Rash  . Penicillins Rash  . Sulfa Antibiotics Rash  . Vesicare [Solifenacin] Other (See Comments) and Palpitations    Reaction: Palpations      Assessment & Plan:  Patient presents for a monthly Unna boot therapy follow-up.  The patient presents today without complaint.  The patient has been using her newly received lymphedema pump for approximately 1 week.  She notes some sensitivity to her skin since starting to use her lymphedema pump.  The company representative recommend using the lymphedema pump twice a day for an hour each time.  The patient's ulceration has now healed into a scab.  The patient reports improvement in her lower extremity edema.  Denies any fever, nausea or vomiting.  She was seen with her daughter.  1. Lymphedema - Stable The patient has been using her lymphedema pump for approximately one week twice a day which has caused a sensitivity to her skin Recommend using her lymphedema pump once a day towards the evening hours for at least an hour hopefully this will alleviate the sensitivity Is okay to stop Unna boot therapy at this time now that the patient's  edema is controlled and her ulceration is healed The patient's daughter would like her to follow-up in 2 weeks to assure her ulceration is healed  I would like the patient to follow up in 2 months to assess her progress with her new lymphedema pump  The patient is to transition into wearing medical grade 1 compression stockings on a daily basis Patient is to elevate her legs heart level or higher  2. Venous ulcer of ankle, left (HCC) - Healed Ulceration has now healed  3. Chronic venous insufficiency - Stable As above  Current Outpatient Medications on File Prior to Visit  Medication  Sig Dispense Refill  . albuterol (PROVENTIL HFA;VENTOLIN HFA) 108 (90 Base) MCG/ACT inhaler Inhale 2 puffs into the lungs every 6 (six) hours as needed for wheezing.    Marland Kitchen albuterol (PROVENTIL) (2.5 MG/3ML) 0.083% nebulizer solution Take 2.5 mg by nebulization every 6 (six) hours as needed for wheezing.    Marland Kitchen ALPRAZolam (XANAX) 0.5 MG tablet   1  . amLODipine (NORVASC) 5 MG tablet Take 1 tablet by mouth daily.  4  . atorvastatin (LIPITOR) 20 MG tablet Take 1 tablet by mouth daily.  2  . bisacodyl (DULCOLAX) 10 MG suppository Place 1 suppository (10 mg total) rectally daily. 12 suppository 0  . Calcium Carb-Cholecalciferol (CALCIUM-VITAMIN D) 500-200 MG-UNIT tablet Take by mouth.    . calcium-vitamin D (OSCAL WITH D) 500-200 MG-UNIT TABS tablet Take by mouth.    . chlorpheniramine-HYDROcodone (TUSSIONEX) 10-8 MG/5ML SUER Take 5 mLs by mouth every 12 (twelve) hours as needed for cough. 140 mL 0  . Cholecalciferol (VITAMIN D3) 5000 units TABS Take 1 tablet by mouth daily.    Marland Kitchen docusate sodium (COLACE) 100 MG capsule Take 1 capsule (100 mg total) by mouth 2 (two) times daily. 10 capsule 0  . fluticasone (FLOVENT HFA) 220 MCG/ACT inhaler Inhale 1 puff into the lungs 2 (two) times daily.    . furosemide (LASIX) 20 MG tablet Take 1 tablet by mouth daily.  5  . guaiFENesin (MUCINEX) 600 MG 12 hr tablet Take 1 tablet  (600 mg total) by mouth 2 (two) times daily. 20 tablet 2  . hydrocortisone 2.5 % cream Apply 1 application topically 2 (two) times daily.    Marland Kitchen ketoconazole (NIZORAL) 2 % shampoo Apply 1 application topically 2 (two) times a week.    . levothyroxine (SYNTHROID, LEVOTHROID) 88 MCG tablet   10  . meloxicam (MOBIC) 7.5 MG tablet   0  . metoprolol succinate (TOPROL-XL) 25 MG 24 hr tablet Take by mouth.    . mupirocin ointment (BACTROBAN) 2 % Apply two times a day for 7 days. 22 g 0  . potassium chloride SA (K-DUR,KLOR-CON) 20 MEQ tablet Take 1 tablet by mouth daily.  5  . predniSONE (DELTASONE) 5 MG tablet Take 1 tablet by mouth daily.  1  . Probiotic Product (PROBIOTIC-10 ULTIMATE PO) Take by mouth.    . Probiotic Product (PROBIOTIC-10) CAPS Take by mouth.    . sertraline (ZOLOFT) 50 MG tablet   10  . SPIRIVA RESPIMAT 2.5 MCG/ACT AERS Take 2 puffs by mouth daily.  10  . SYMBICORT 160-4.5 MCG/ACT inhaler   11  . torsemide (DEMADEX) 20 MG tablet Take by mouth.    . triamcinolone cream (KENALOG) 0.1 %   0   No current facility-administered medications on file prior to visit.     There are no Patient Instructions on file for this visit. No Follow-up on file.   Carmell Elgin A Danitza Schoenfeldt, PA-C

## 2017-09-16 ENCOUNTER — Ambulatory Visit (INDEPENDENT_AMBULATORY_CARE_PROVIDER_SITE_OTHER): Payer: Medicare Other | Admitting: Vascular Surgery

## 2017-09-16 ENCOUNTER — Encounter (INDEPENDENT_AMBULATORY_CARE_PROVIDER_SITE_OTHER): Payer: Self-pay | Admitting: Vascular Surgery

## 2017-09-16 VITALS — BP 129/71 | HR 74 | Resp 17 | Wt 132.8 lb

## 2017-09-16 DIAGNOSIS — I872 Venous insufficiency (chronic) (peripheral): Secondary | ICD-10-CM | POA: Diagnosis not present

## 2017-09-16 DIAGNOSIS — I83023 Varicose veins of left lower extremity with ulcer of ankle: Secondary | ICD-10-CM

## 2017-09-16 DIAGNOSIS — I70213 Atherosclerosis of native arteries of extremities with intermittent claudication, bilateral legs: Secondary | ICD-10-CM

## 2017-09-16 DIAGNOSIS — I890 Lymphedema, not elsewhere classified: Secondary | ICD-10-CM | POA: Diagnosis not present

## 2017-09-16 DIAGNOSIS — I70223 Atherosclerosis of native arteries of extremities with rest pain, bilateral legs: Secondary | ICD-10-CM

## 2017-09-16 DIAGNOSIS — L97329 Non-pressure chronic ulcer of left ankle with unspecified severity: Secondary | ICD-10-CM

## 2017-09-16 NOTE — Progress Notes (Signed)
MRN : [id number]  Danielle Williamson is a 83 y.o. (15-Apr-1932) female who presents with chief complaint of  Chief Complaint  Patient presents with  . Follow-up    2wk wound check,37mth lymphedema check  .  History of Present Illness: Patient is seen for follow up evaluation of leg pain and swelling associated with venous ulceration. The patient was recently seen here and started on [place] boot therapy.  The swelling abruptly became much worse bilaterally and is associated with pain and discoloration. The pain and swelling worsens with prolonged dependency and improves with elevation.  The patient notes that in the morning the legs are better but the leg symptoms worsened throughout the course of the day. The patient has also noted a progressive worsening of the discoloration in the ankle and shin area.   The patient notes that an ulcer has developed acutely without specific trauma and since it occurred it has been very slow to heal.    The patient states that they have been elevating as much as possible. The patient denies any recent changes in medications.  The patient denies a history of DVT or PE. There is no prior history of phlebitis. There is no history of primary lymphedema.  No SOB or increased cough.  No sputum production.  No recent episodes of CHF exacerbation.   Current Meds  Medication Sig  . albuterol (PROVENTIL HFA;VENTOLIN HFA) 108 (90 Base) MCG/ACT inhaler Inhale 2 puffs into the lungs every 6 (six) hours as needed for wheezing.  Marland Kitchen albuterol (PROVENTIL) (2.5 MG/3ML) 0.083% nebulizer solution Take 2.5 mg by nebulization every 6 (six) hours as needed for wheezing.  Marland Kitchen ALPRAZolam (XANAX) 0.5 MG tablet   . amLODipine (NORVASC) 5 MG tablet Take 1 tablet by mouth daily.  Marland Kitchen atorvastatin (LIPITOR) 20 MG tablet Take 1 tablet by mouth daily.  . bisacodyl (DULCOLAX) 10 MG suppository Place 1 suppository (10 mg total) rectally daily.  . Calcium Carb-Cholecalciferol (CALCIUM-VITAMIN D)  500-200 MG-UNIT tablet Take by mouth.  . calcium-vitamin D (OSCAL WITH D) 500-200 MG-UNIT TABS tablet Take by mouth.  . chlorpheniramine-HYDROcodone (TUSSIONEX) 10-8 MG/5ML SUER Take 5 mLs by mouth every 12 (twelve) hours as needed for cough.  . Cholecalciferol (VITAMIN D3) 5000 units TABS Take 1 tablet by mouth daily.  Marland Kitchen docusate sodium (COLACE) 100 MG capsule Take 1 capsule (100 mg total) by mouth 2 (two) times daily.  . fluticasone (FLOVENT HFA) 220 MCG/ACT inhaler Inhale 1 puff into the lungs 2 (two) times daily.  . furosemide (LASIX) 20 MG tablet Take 1 tablet by mouth daily.  Marland Kitchen guaiFENesin (MUCINEX) 600 MG 12 hr tablet Take 1 tablet (600 mg total) by mouth 2 (two) times daily.  . hydrocortisone 2.5 % cream Apply 1 application topically 2 (two) times daily.  Marland Kitchen ketoconazole (NIZORAL) 2 % shampoo Apply 1 application topically 2 (two) times a week.  . levothyroxine (SYNTHROID, LEVOTHROID) 88 MCG tablet   . meloxicam (MOBIC) 7.5 MG tablet   . metoprolol succinate (TOPROL-XL) 25 MG 24 hr tablet Take by mouth.  . mupirocin ointment (BACTROBAN) 2 % Apply two times a day for 7 days.  . potassium chloride SA (K-DUR,KLOR-CON) 20 MEQ tablet Take 1 tablet by mouth daily.  . predniSONE (DELTASONE) 5 MG tablet Take 1 tablet by mouth daily.  . Probiotic Product (PROBIOTIC-10 ULTIMATE PO) Take by mouth.  . Probiotic Product (PROBIOTIC-10) CAPS Take by mouth.  . sertraline (ZOLOFT) 50 MG tablet   . SPIRIVA RESPIMAT  2.5 MCG/ACT AERS Take 2 puffs by mouth daily.  . SYMBICORT 160-4.5 MCG/ACT inhaler   . torsemide (DEMADEX) 20 MG tablet Take by mouth.  . triamcinolone cream (KENALOG) 0.1 %     Past Medical History:  Diagnosis Date  . Allergy   . Aneurysm ([employer])    thoracic aortic aneurysm  . Aneurysm, thoracic aortic (HCC) Abdominal Aneurysm  . Anxiety   . Aortic insufficiency   . Arthritis   . Asthma   . CAD (coronary artery disease)    no h/o stents or CABG  . Cataract Bilateral Eyes  .  Constipation   . COPD (chronic obstructive pulmonary disease) (HCC)    on nocturnal o2  . Diastolic CHF ([place])   . History of adenomatous polyp of colon   . History of uterine cancer   . Hyperlipidemia   . Hypertension   . Pulmonary emphysema ([place])   . Skin cancer   . Thyroid disease    hypo  . UTI (lower urinary tract infection)     Past Surgical History:  Procedure Laterality Date  . ABDOMINAL HYSTERECTOMY    . BREAST CYST ASPIRATION Left 1985   neg  . CHOLECYSTECTOMY    . SHOULDER ARTHROSCOPY W/ CAPSULAR REPAIR     Left  . TOE SURGERY     Right toe surgery    Social History Social History   Tobacco Use  . Smoking status: Former Smoker    Years: 20.00  . Smokeless tobacco: Never Used  . Tobacco comment: Quit 2 years ago.   Substance Use Topics  . Alcohol use: Yes    Alcohol/week: 0.0 oz    Comment: rarely  . Drug use: No    Family History Family History  Problem Relation Age of Onset  . Breast cancer Cousin   . Heart attack Mother   . Hypertension Mother   . Bladder Cancer Father   . Prostate cancer Father   . Diabetes Sister   . Prostate cancer Brother   . AAA (abdominal aortic aneurysm) Brother     Allergies  Allergen Reactions  . Epinephrine   . Furosemide     Other reaction(s): Other (See Comments)  . Lopid [Gemfibrozil] Other (See Comments)    Other reaction(s): Muscle Pain Reaction: Muscle pain  . Lovastatin Other (See Comments)    Other reaction(s): Muscle Pain Reaction: Muscle pain  . Neosporin [Neomycin-Bacitracin Zn-Polymyx]   . Polysporin [Bacitracin-Polymyxin B]   . Sertraline Nausea Only  . Iodine Rash    Hives per patient (10/27/15)   . Moxifloxacin Palpitations  . Penicillin V Potassium Rash  . Penicillins Rash  . Sulfa Antibiotics Rash  . Vesicare [Solifenacin] Other (See Comments) and Palpitations    Reaction: Palpations     REVIEW OF SYSTEMS (Negative unless checked)  Constitutional: [] Weight loss  [] Fever   [] Chills Cardiac: [] Chest pain   [] Chest pressure   [] Palpitations   [] Shortness of breath when laying flat   [] Shortness of breath with exertion. Vascular:  [x] Pain in legs with walking   [] Pain in legs at rest  [] History of DVT   [] Phlebitis   [] Swelling in legs   [] Varicose veins   [] Non-healing ulcers Pulmonary:   [] Uses home oxygen   [] Productive cough   [] Hemoptysis   [] Wheeze  [] COPD   [] Asthma Neurologic:  [] Dizziness   [] Seizures   [] History of stroke   [] History of TIA  [] Aphasia   [] Vissual changes   [] Weakness or numbness in arm   []   Weakness or numbness in leg Musculoskeletal:   [] Joint swelling   [] Joint pain   [] Low back pain Hematologic:  [] Easy bruising  [] Easy bleeding   [] Hypercoagulable state   [] Anemic Gastrointestinal:  [] Diarrhea   [] Vomiting  [] Gastroesophageal reflux/heartburn   [] Difficulty swallowing. Genitourinary:  [] Chronic kidney disease   [] Difficult urination  [] Frequent urination   [] Blood in urine Skin:  [] Rashes   [] Ulcers  Psychological:  [] History of anxiety   []  History of major depression.  Physical Examination  Vitals:   09/16/17 1128  BP: 129/71  Pulse: 74  Resp: 17  Weight: 60.2 kg (132 lb 12.8 oz)   Body mass index is 23.52 kg/m. Gen: WD/WN, NAD Head: [place]/AT, No temporalis wasting.  Ear/Nose/Throat: Hearing grossly intact, nares w/o erythema or drainage Eyes: PER, EOMI, sclera nonicteric.  Neck: Supple, no large masses.   Pulmonary:  Good air movement, no audible wheezing bilaterally, no use of accessory muscles.  Cardiac: RRR, no JVD Vascular: venous ulcer left ankle is now healed 2+ edema with mild venous changes Vessel Right Left  Radial Palpable Palpable  PT Trace Palpable Trace Palpable  DP Trace Palpable Trace Palpable  Gastrointestinal: Non-distended. No guarding/no peritoneal signs.  Musculoskeletal: M/S 5/5 throughout.  No deformity or atrophy.  Neurologic: CN 2-12 intact. Symmetrical.  Speech is fluent. Motor exam as listed  above. Psychiatric: Judgment intact, Mood & affect appropriate for pt's clinical situation. Dermatologic: venous rashes no ulcers noted.  No changes consistent with cellulitis. Lymph : No lichenification or skin changes of chronic lymphedema.  CBC Lab Results  Component Value Date   WBC 17.6 (H) 10/29/2015   HGB 11.8 (L) 10/29/2015   HCT 35.7 10/29/2015   MCV 91.4 10/29/2015   PLT 239 10/29/2015    BMET    Component Value Date/Time   NA 140 10/28/2015 0505   K 3.7 10/28/2015 0505   CL 109 10/28/2015 0505   CO2 25 10/28/2015 0505   GLUCOSE 131 (H) 10/28/2015 0505   BUN 13 10/28/2015 0505   CREATININE 1.00 04/16/2016 1319   CALCIUM 8.1 (L) 10/28/2015 0505   GFRNONAA >60 10/28/2015 0505   GFRAA >60 10/28/2015 0505   CrCl cannot be calculated (Patient's most recent lab result is older than the maximum 21 days allowed.).  COAG No results found for: INR, PROTIME  Radiology No results found.   Assessment/Plan 1. Venous ulcer of ankle, left ([place]) Now healed continue compression  2. Chronic venous insufficiency No surgery or intervention at this point in time.    I have had a long discussion with the patient regarding venous insufficiency and why it  causes symptoms. I have discussed with the patient the chronic skin changes that accompany venous insufficiency and the long term sequela such as infection and ulceration.  Patient will begin wearing graduated compression stockings class 1 (20-30 mmHg) or compression wraps on a daily basis a prescription was given. The patient will put the stockings on first thing in the morning and removing them in the evening. The patient is instructed specifically not to sleep in the stockings.    In addition, behavioral modification including several periods of elevation of the lower extremities during the day will be continued. I have demonstrated that proper elevation is a position with the ankles at heart level.  The patient is instructed  to begin routine exercise, especially walking on a daily basis  The patient  Should continue using her Lymph Pump at that time  3. Lymphedema  No  surgery or intervention at this point in time.    I have reviewed my discussion with the patient regarding lymphedema and why it  causes symptoms.  Patient will continue wearing graduated compression stockings class 1 (20-30 mmHg) on a daily basis a prescription was given. The patient is reminded to put the stockings on first thing in the morning and removing them in the evening. The patient is instructed specifically not to sleep in the stockings.   In addition, behavioral modification throughout the day will be continued.  This will include frequent elevation (such as in a recliner), use of over the counter pain medications as needed and exercise such as walking.  I have reviewed systemic causes for chronic edema such as liver, kidney and cardiac etiologies and there does not appear to be any significant changes in these organ systems over the past year.  The patient is under the impression that these organ systems are all stable and unchanged.    The patient will continue aggressive use of the  lymph pump.  This will continue to improve the edema control and prevent sequela such as ulcers and infections.   4. Atherosclerosis of native artery of both lower extremities with intermittent claudication (HCC)  Recommend:  The patient has evidence of atherosclerosis of the lower extremities with claudication.  The patient does not voice lifestyle limiting changes at this point in time.  Noninvasive studies do not suggest clinically significant change.  No invasive studies, angiography or surgery at this time The patient should continue walking and begin a more formal exercise program.  The patient should continue antiplatelet therapy and aggressive treatment of the lipid abnormalities  No changes in the patient's medications at this time  The  patient should continue wearing graduated compression socks 10-15 mmHg strength to control the mild edema.      Hortencia Pilar, MD  09/16/2017 12:46 PM

## 2017-10-02 ENCOUNTER — Encounter: Payer: Self-pay | Admitting: Intensive Care

## 2017-10-02 ENCOUNTER — Emergency Department: Payer: Medicare Other

## 2017-10-02 ENCOUNTER — Emergency Department
Admission: EM | Admit: 2017-10-02 | Discharge: 2017-10-02 | Disposition: A | Discharge status: Home / Self Care | Payer: Medicare Other | Attending: Student in an Organized Health Care Education/Training Program | Admitting: Student in an Organized Health Care Education/Training Program

## 2017-10-02 DIAGNOSIS — S81811A Laceration without foreign body, right lower leg, initial encounter: Secondary | ICD-10-CM | POA: Diagnosis not present

## 2017-10-02 DIAGNOSIS — Z87891 Personal history of nicotine dependence: Secondary | ICD-10-CM | POA: Diagnosis not present

## 2017-10-02 DIAGNOSIS — Z79899 Other long term (current) drug therapy: Secondary | ICD-10-CM | POA: Diagnosis not present

## 2017-10-02 DIAGNOSIS — W102XXA Fall (on)(from) incline, initial encounter: Secondary | ICD-10-CM | POA: Insufficient documentation

## 2017-10-02 DIAGNOSIS — T148XXA Other injury of unspecified body region, initial encounter: Secondary | ICD-10-CM

## 2017-10-02 DIAGNOSIS — I2510 Atherosclerotic heart disease of native coronary artery without angina pectoris: Secondary | ICD-10-CM | POA: Insufficient documentation

## 2017-10-02 DIAGNOSIS — E039 Hypothyroidism, unspecified: Secondary | ICD-10-CM | POA: Diagnosis not present

## 2017-10-02 DIAGNOSIS — I110 Hypertensive heart disease with heart failure: Secondary | ICD-10-CM | POA: Diagnosis not present

## 2017-10-02 DIAGNOSIS — S8991XA Unspecified injury of right lower leg, initial encounter: Secondary | ICD-10-CM | POA: Diagnosis present

## 2017-10-02 DIAGNOSIS — J449 Chronic obstructive pulmonary disease, unspecified: Secondary | ICD-10-CM | POA: Insufficient documentation

## 2017-10-02 DIAGNOSIS — I509 Heart failure, unspecified: Secondary | ICD-10-CM | POA: Diagnosis not present

## 2017-10-02 DIAGNOSIS — J45909 Unspecified asthma, uncomplicated: Secondary | ICD-10-CM | POA: Insufficient documentation

## 2017-10-02 DIAGNOSIS — Z791 Long term (current) use of non-steroidal anti-inflammatories (NSAID): Secondary | ICD-10-CM | POA: Insufficient documentation

## 2017-10-02 DIAGNOSIS — Y92008 Other place in unspecified non-institutional (private) residence as the place of occurrence of the external cause: Secondary | ICD-10-CM | POA: Diagnosis not present

## 2017-10-02 DIAGNOSIS — Y999 Unspecified external cause status: Secondary | ICD-10-CM | POA: Insufficient documentation

## 2017-10-02 DIAGNOSIS — Y9389 Activity, other specified: Secondary | ICD-10-CM | POA: Insufficient documentation

## 2017-10-02 LAB — URINALYSIS, COMPLETE (UACMP) WITH MICROSCOPIC
BACTERIA UA: NONE SEEN
BILIRUBIN URINE: NEGATIVE
Glucose, UA: NEGATIVE mg/dL
HGB URINE DIPSTICK: NEGATIVE
KETONES UR: NEGATIVE mg/dL
NITRITE: NEGATIVE
PROTEIN: NEGATIVE mg/dL
SPECIFIC GRAVITY, URINE: 1.005 (ref 1.005–1.030)
pH: 7 (ref 5.0–8.0)

## 2017-10-02 MED ORDER — LIDOCAINE-EPINEPHRINE-TETRACAINE (LET) SOLUTION
NASAL | Status: AC
  Filled 2017-10-02: qty 12

## 2017-10-02 MED ORDER — LIDOCAINE-EPINEPHRINE-TETRACAINE (LET) SOLUTION
12.0000 mL | Freq: Once | NASAL | Status: AC
  Administered 2017-10-02: 12 mL via TOPICAL

## 2017-10-02 NOTE — ED Triage Notes (Signed)
Patient reports tripping on stair and her right shin ran down the stair and it tore the layer of skin off her R shin. Denies being on blood thinners. Ambulatory with no problems just pain to R shin. C/o pain on Right shin. Denies hitting head

## 2017-10-02 NOTE — ED Notes (Signed)
Skin tear fixed by Dr Quentin Cornwall and dressed

## 2017-10-02 NOTE — ED Provider Notes (Signed)
[facility] Emergency Department Provider Note    First MD Initiated Contact with Patient 10/02/17 1850     (approximate)  I have reviewed the triage vital signs and the nursing notes.   HISTORY  Chief Complaint Fall    HPI Danielle Williamson is a 83 y.o. female presents after a fall.  She is complaining of right shin pain.  States that she was getting up in her attic and while taking a step she missed the step and edge of the stair grind against her right shin.  Patient is not on any blood thinners but did have fair amount of bleeding.  She did not hit her head.  States the pain is mild to moderate.  Denies any other complaints.  Patient presented to ER because she could not get the bleeding to stop.  Had issues with skin tears in the past.  Past Medical History:  Diagnosis Date  . Allergy   . Aneurysm ([place])    thoracic aortic aneurysm  . Aneurysm, thoracic aortic (HCC) Abdominal Aneurysm  . Anxiety   . Aortic insufficiency   . Arthritis   . Asthma   . CAD (coronary artery disease)    no h/o stents or CABG  . Cataract Bilateral Eyes  . Constipation   . COPD (chronic obstructive pulmonary disease) (HCC)    on nocturnal o2  . Diastolic CHF ([place])   . History of adenomatous polyp of colon   . History of uterine cancer   . Hyperlipidemia   . Hypertension   . Pulmonary emphysema ([place])   . Skin cancer   . Thyroid disease    hypo  . UTI (lower urinary tract infection)    Family History  Problem Relation Age of Onset  . Breast cancer Cousin   . Heart attack Mother   . Hypertension Mother   . Bladder Cancer Father   . Prostate cancer Father   . Diabetes Sister   . Prostate cancer Brother   . AAA (abdominal aortic aneurysm) Brother    Past Surgical History:  Procedure Laterality Date  . ABDOMINAL HYSTERECTOMY    . BREAST CYST ASPIRATION Left 1985   neg  . CHOLECYSTECTOMY    . SHOULDER ARTHROSCOPY W/ CAPSULAR REPAIR     Left  . TOE SURGERY      Right toe surgery   Patient Active Problem List   Diagnosis Date Noted  . Lymphedema 08/05/2017  . Atherosclerosis of native arteries of extremity with intermittent claudication ([place]) 07/20/2017  . Venous ulcer of ankle, left ([place]) 07/16/2017  . Chronic venous insufficiency 07/16/2017  . Aneurysm of thoracic aorta ([place]) 07/09/2017  . Right lower lobe pneumonia ([place]) 10/29/2015  . Pleuritic chest pain 10/29/2015  . Dyspnea 10/29/2015  . General weakness 10/29/2015  . Leukocytosis 10/29/2015  . Sepsis ([place]) 10/27/2015      Prior to Admission medications   Medication Sig Start Date End Date Taking? Authorizing Provider  albuterol (PROVENTIL HFA;VENTOLIN HFA) 108 (90 Base) MCG/ACT inhaler Inhale 2 puffs into the lungs every 6 (six) hours as needed for wheezing.    [provider]  albuterol (PROVENTIL) (2.5 MG/3ML) 0.083% nebulizer solution Take 2.5 mg by nebulization every 6 (six) hours as needed for wheezing.    [provider]  ALPRAZolam Duanne Moron) 0.5 MG tablet  05/24/17   [provider]  amLODipine (NORVASC) 5 MG tablet Take 1 tablet by mouth daily. 10/10/15   [provider]  atorvastatin (  LIPITOR) 20 MG tablet Take 1 tablet by mouth daily. 08/08/15   [provider]  bisacodyl (DULCOLAX) 10 MG suppository Place 1 suppository (10 mg total) rectally daily. 10/29/15   Theodoro Grist, MD  Calcium Carb-Cholecalciferol (CALCIUM-VITAMIN D) 500-200 MG-UNIT tablet Take by mouth.    [provider]  calcium-vitamin D (OSCAL WITH D) 500-200 MG-UNIT TABS tablet Take by mouth.    [provider]  chlorpheniramine-HYDROcodone (TUSSIONEX) 10-8 MG/5ML SUER Take 5 mLs by mouth every 12 (twelve) hours as needed for cough. 10/29/15   Theodoro Grist, MD  Cholecalciferol (VITAMIN D3) 5000 units TABS Take 1 tablet by mouth daily.    [provider]  docusate sodium (COLACE) 100 MG capsule Take 1 capsule (100 mg total) by mouth 2 (two)  times daily. 10/29/15   Theodoro Grist, MD  fluticasone (FLOVENT HFA) 220 MCG/ACT inhaler Inhale 1 puff into the lungs 2 (two) times daily.    [provider]  furosemide (LASIX) 20 MG tablet Take 1 tablet by mouth daily. 09/14/15   [provider]  guaiFENesin (MUCINEX) 600 MG 12 hr tablet Take 1 tablet (600 mg total) by mouth 2 (two) times daily. 10/29/15   Theodoro Grist, MD  hydrocortisone 2.5 % cream Apply 1 application topically 2 (two) times daily.    [provider]  ketoconazole (NIZORAL) 2 % shampoo Apply 1 application topically 2 (two) times a week.    [provider]  levothyroxine (SYNTHROID, LEVOTHROID) 88 MCG tablet  06/15/17   [provider]  meloxicam (MOBIC) 7.5 MG tablet  07/08/17   [provider]  metoprolol succinate (TOPROL-XL) 25 MG 24 hr tablet Take by mouth. 07/24/17 07/24/18  [provider]  mupirocin ointment (BACTROBAN) 2 % Apply two times a day for 7 days. 07/03/17   Marylene Land, NP  potassium chloride SA (K-DUR,KLOR-CON) 20 MEQ tablet Take 1 tablet by mouth daily. 09/14/15   [provider]  predniSONE (DELTASONE) 5 MG tablet Take 1 tablet by mouth daily. 10/25/15   [provider]  Probiotic Product (PROBIOTIC-10 ULTIMATE PO) Take by mouth.    [provider]  Probiotic Product (PROBIOTIC-10) CAPS Take by mouth.    [provider]  sertraline (ZOLOFT) 50 MG tablet  06/13/17   [provider]  SPIRIVA RESPIMAT 2.5 MCG/ACT AERS Take 2 puffs by mouth daily. 10/25/15   [provider]  SYMBICORT 160-4.5 MCG/ACT inhaler  06/25/17   [provider]  torsemide (DEMADEX) 20 MG tablet Take by mouth.    [provider]  triamcinolone cream (KENALOG) 0.1 %  05/14/17   [provider]    Allergies Epinephrine; Furosemide; Lopid [gemfibrozil]; Lovastatin; Neosporin [neomycin-bacitracin zn-polymyx]; Polysporin [bacitracin-polymyxin b];  Sertraline; Iodine; Moxifloxacin; Penicillin v potassium; Penicillins; Sulfa antibiotics; and Vesicare [solifenacin]    Social History Social History   Tobacco Use  . Smoking status: Former Smoker    Years: 20.00  . Smokeless tobacco: Never Used  . Tobacco comment: Quit 2 years ago.   Substance Use Topics  . Alcohol use: Yes    Alcohol/week: 0.0 oz    Comment: rarely  . Drug use: No    Review of Systems Patient denies headaches, rhinorrhea, blurry vision, numbness, shortness of breath, chest pain, edema, cough, abdominal pain, nausea, vomiting, diarrhea, dysuria, fevers, rashes or hallucinations unless otherwise stated above in HPI. ____________________________________________   PHYSICAL EXAM:  VITAL SIGNS: Vitals:   10/02/17 1815  BP: (!) 180/77  Pulse: 77  Resp:  17  Temp: 98.1 F (36.7 C)  SpO2: 95%    Constitutional: Alert and oriented. Well appearing and in no acute distress. Eyes: Conjunctivae are normal.  Head: Atraumatic. Nose: No congestion/rhinnorhea. Mouth/Throat: Mucous membranes are moist.   Neck: No stridor. Painless ROM.  Cardiovascular: Normal rate, regular rhythm. Grossly normal heart sounds.  Good peripheral circulation. Respiratory: Normal respiratory effort.  No retractions. Lungs CTAB. Gastrointestinal: Soft and nontender. No distention. No abdominal bruits. No CVA tenderness. Genitourinary:  Musculoskeletal: Tenderness palpation of the anterior shin with large area of ecchymosis totaling nearly entire anterior portion of her leg with to symmetric 8 cm skin tears that are hemostatic.  No evidence of erythema.  Neurologic:  Normal speech and language. No gross focal neurologic deficits are appreciated. No facial droop Skin:  Skin is warm, dry and intact. No rash noted. Psychiatric: Mood and affect are normal. Speech and behavior are normal.  ____________________________________________   LABS (all labs ordered are listed, but only abnormal  results are displayed)  Results for orders placed or performed during the hospital encounter of 10/02/17 (from the past 24 hour(s))  Urinalysis, Complete w Microscopic     Status: Abnormal   Collection Time: 10/02/17  6:23 PM  Result Value Ref Range   Color, Urine STRAW (A) YELLOW   APPearance CLEAR (A) CLEAR   Specific Gravity, Urine 1.005 1.005 - 1.030   pH 7.0 5.0 - 8.0   Glucose, UA NEGATIVE NEGATIVE mg/dL   Hgb urine dipstick NEGATIVE NEGATIVE   Bilirubin Urine NEGATIVE NEGATIVE   Ketones, ur NEGATIVE NEGATIVE mg/dL   Protein, ur NEGATIVE NEGATIVE mg/dL   Nitrite NEGATIVE NEGATIVE   Leukocytes, UA SMALL (A) NEGATIVE   RBC / HPF 0-5 0 - 5 RBC/hpf   WBC, UA 6-30 0 - 5 WBC/hpf   Bacteria, UA NONE SEEN NONE SEEN   Squamous Epithelial / LPF 0-5 (A) NONE SEEN   Mucus PRESENT    ____________________________________________ RADIOLOGY  I personally reviewed all radiographic images ordered to evaluate for the above acute complaints and reviewed radiology reports and findings.  These findings were personally discussed with the patient.  Please see medical record for radiology report.  ____________________________________________   PROCEDURES  Procedure(s) performed:  Procedures    Critical Care performed: no ____________________________________________   INITIAL IMPRESSION / ASSESSMENT AND PLAN / ED COURSE  Pertinent labs & imaging results that were available during my care of the patient were reviewed by me and considered in my medical decision making (see chart for details).  DDX: laceration, contusion, fracture,   Danielle Williamson is a 83 y.o. who presents to the ED with skin tears to the right leg.  No evidence of fracture.  Wound care provided.  The skin tear wound was approximated with Steri-Strips and left open with Xeroform gauze.  Tetanus is up-to-date.  Patient will give referral to wound clinic.      ____________________________________________   FINAL  CLINICAL IMPRESSION(S) / ED DIAGNOSES  Final diagnoses:  Multiple skin tears      NEW MEDICATIONS STARTED DURING THIS VISIT:  This SmartLink is deprecated. Use AVSMEDLIST instead to display the medication list for a patient.   Note:  This document was prepared using Dragon voice recognition software and may include unintentional dictation errors.    Merlyn Lot, MD 10/02/17 [phone]

## 2017-10-02 NOTE — ED Notes (Signed)
Pt slipped/tripped and fell on a step causing a skin tear that is approx the length of her right lower leg/shin

## 2017-10-02 NOTE — Discharge Instructions (Signed)
Follow up with PCP.  Contact wound care clinic for follow-up and evaluation of your skin tears.  Keep dressed for 12-24 hours.  Tomorrow he may take down the dressing but keep the Xeroform, yellow gauze.  May gently rinse the skin tears with warm water and soap.  Do not scrub.  Dry and then redress.  Return for any questions, fevers or worsening pain.

## 2017-10-07 ENCOUNTER — Encounter: Payer: Medicare Other | Attending: Surgery | Admitting: Surgery

## 2017-10-07 DIAGNOSIS — I739 Peripheral vascular disease, unspecified: Secondary | ICD-10-CM | POA: Insufficient documentation

## 2017-10-07 DIAGNOSIS — F410 Panic disorder [episodic paroxysmal anxiety] without agoraphobia: Secondary | ICD-10-CM | POA: Diagnosis not present

## 2017-10-07 DIAGNOSIS — J449 Chronic obstructive pulmonary disease, unspecified: Secondary | ICD-10-CM | POA: Insufficient documentation

## 2017-10-07 DIAGNOSIS — Z79899 Other long term (current) drug therapy: Secondary | ICD-10-CM | POA: Diagnosis not present

## 2017-10-07 DIAGNOSIS — I890 Lymphedema, not elsewhere classified: Secondary | ICD-10-CM | POA: Insufficient documentation

## 2017-10-07 DIAGNOSIS — I10 Essential (primary) hypertension: Secondary | ICD-10-CM | POA: Diagnosis not present

## 2017-10-07 DIAGNOSIS — W19XXXA Unspecified fall, initial encounter: Secondary | ICD-10-CM | POA: Diagnosis not present

## 2017-10-07 DIAGNOSIS — Z87891 Personal history of nicotine dependence: Secondary | ICD-10-CM | POA: Diagnosis not present

## 2017-10-07 DIAGNOSIS — Z880 Allergy status to penicillin: Secondary | ICD-10-CM | POA: Insufficient documentation

## 2017-10-07 DIAGNOSIS — F329 Major depressive disorder, single episode, unspecified: Secondary | ICD-10-CM | POA: Insufficient documentation

## 2017-10-07 DIAGNOSIS — S81811A Laceration without foreign body, right lower leg, initial encounter: Secondary | ICD-10-CM | POA: Insufficient documentation

## 2017-10-07 DIAGNOSIS — L97212 Non-pressure chronic ulcer of right calf with fat layer exposed: Secondary | ICD-10-CM | POA: Diagnosis not present

## 2017-10-07 DIAGNOSIS — M069 Rheumatoid arthritis, unspecified: Secondary | ICD-10-CM | POA: Insufficient documentation

## 2017-10-08 NOTE — Progress Notes (Addendum)
Danielle Williamson, Danielle Williamson ([id number]) Visit Report for 10/07/2017 Chief Complaint Document Details Patient Name: Danielle Williamson, Danielle Williamson Date of Service: 10/07/2017 2:30 PM Medical Record Number: [id number] Patient Account Number: [id number] Date of Birth/Sex: 1931-11-27 (83 y.o. Female) Treating RN: Danielle Williamson Primary Care Provider: Harrel Williamson Other Clinician: Referring Provider: Merlyn Williamson Treating Provider/Extender: Danielle Williamson in Treatment: 0 Information Obtained from: Patient Chief Complaint Patient seen for complaints of Non-Healing Wound to the right lower extremity which was caused by an accidental fall about a week ago Electronic Signature(s) Signed: 10/07/2017 4:32:17 PM By: Danielle Fudge MD, FACS Entered By: Danielle Williamson on 10/07/2017 16:32:17 Danielle Williamson, Danielle Williamson ([id number]) -------------------------------------------------------------------------------- HPI Details Patient Name: Danielle Williamson Date of Service: 10/07/2017 2:30 PM Medical Record Number: [id number] Patient Account Number: [id number] Date of Birth/Sex: 1932/10/13 (83 y.o. Female) Treating RN: Danielle Williamson Primary Care Provider: Harrel Williamson Other Clinician: Referring Provider: Merlyn Williamson Treating Provider/Extender: Danielle Williamson in Treatment: 0 History of Present Illness Location: right lower leg Quality: Patient reports experiencing a dull pain to affected area(s). Severity: Patient states wound are getting worse. Duration: Patient has had the wound for < 2 weeks prior to presenting for treatment Timing: Pain in wound is constant (hurts all the time) Context: The wound occurred when the patient had a fall and had significant skin tears and went to the ER Modifying Factors: Other treatment(s) tried include:the skin was placed back and Steri-Stripped Associated Signs and Symptoms: Patient reports having increase swelling. HPI Description: 83 year old patient has been seen  recently by Dr. Hortencia Pilar of [facility] for leg pain and swelling associated with venous ulceration. She had been seen and treated with Unna boot therapy and the swelling got worse and there was discoloration of the leg. he recommended continuing compression stockings and elevation and exercise and he would not do any surgery at the present time. She should also continue with a lymphedema pump. he also noted evidence of arthrosclerosis of the lower extremity with claudication and noninvasive studies do not suggest clinically significant change. Noninvasive studies, angiogram or surgery at this time. He recommended continuing walking and more formal exercise program. The arterial studies done in September of this year showed a normal ABI bilaterally with the right being 1.03 and the left being 1.14 with triphasic flow bilaterally. She had a DVT study done in August 2018 but I do not see any note off of venous reflux study being done. past medical history significant for COPD, depression, hypertension, panic attacks, peripheral vascular disease, status post cholecystectomy, colonoscopy, hysterectomy and right fourth toe surgery.she quit smoking about 4 years ago and does not drink alcohol. Most recently she was seen on 10/02/2017 after a fall and a skin tear on her right leg with no evidence of fracture.the skin tear was approximated with Steri-Strips and a Xeroform gauze dressing and a tetanus toxoid is up-to-date. Electronic Signature(s) Signed: 10/07/2017 4:33:22 PM By: Danielle Fudge MD, FACS Previous Signature: 10/07/2017 3:25:02 PM Version By: Danielle Fudge MD, FACS Previous Signature: 10/07/2017 3:21:01 PM Version By: Danielle Fudge MD, FACS Previous Signature: 10/07/2017 3:12:34 PM Version By: Danielle Fudge MD, FACS Entered By: Danielle Williamson on 10/07/2017 16:33:22 Danielle Williamson  ([id number]) -------------------------------------------------------------------------------- Physical Exam Details Patient Name: Danielle Williamson Date of Service: 10/07/2017 2:30 PM Medical Record Number: [id number] Patient Account Number: [id number] Date of Birth/Sex: 1931/11/07 (83 y.o. Female) Treating RN: Danielle Williamson Primary Care Provider: Harrel Williamson Other Clinician: Referring Provider: Merlyn Williamson Treating Provider/Extender: Danielle Williamson  Weeks in Treatment: 0 Constitutional . Pulse regular. Respirations normal and unlabored. Afebrile. . Eyes Nonicteric. Reactive to light. Ears, Nose, Mouth, and Throat Lips, teeth, and gums WNL.Marland Kitchen Moist mucosa without lesions. Neck supple and nontender. No palpable supraclavicular or cervical adenopathy. Normal sized without goiter. Respiratory WNL. No retractions.. Cardiovascular Pedal Pulses WNL. recent ABIs done at the vascular office were noted to be normal. No clubbing, cyanosis or edema. Chest Breasts symmetical and no nipple discharge.. Breast tissue WNL, no masses, lumps, or tenderness.. Gastrointestinal (GI) Abdomen without masses or tenderness.. No liver or spleen enlargement or tenderness.. Lymphatic No adneopathy. No adenopathy. No adenopathy. Musculoskeletal Adexa without tenderness or enlargement.. Digits and nails w/o clubbing, cyanosis, infection, petechiae, ischemia, or inflammatory conditions.. Integumentary (Hair, Skin) No suspicious lesions. No crepitus or fluctuance. No peri-wound warmth or erythema. No masses.Marland Kitchen Psychiatric Judgement and insight Intact.. No evidence of depression, anxiety, or agitation.. Notes she has bilateral lymphedema the right is now well controlled with the compression and she has been wearing compression stockings bilaterally. She also uses lymphedema pumps. The lacerated wound has very friable skin which has been pulled back in place and we will not debride it today. Mepitel,  an ABD and a 3 layer Profore will be kept in place. Electronic Signature(s) Signed: 10/07/2017 4:34:48 PM By: Danielle Fudge MD, FACS Entered By: Danielle Williamson on 10/07/2017 16:34:47 Danielle Williamson, Danielle Williamson ([id number]) -------------------------------------------------------------------------------- Physician Orders Details Patient Name: Danielle Williamson Date of Service: 10/07/2017 2:30 PM Medical Record Number: [id number] Patient Account Number: [id number] Date of Birth/Sex: 1932-01-04 (83 y.o. Female) Treating RN: Danielle Williamson Primary Care Provider: Harrel Williamson Other Clinician: Referring Provider: Merlyn Williamson Treating Provider/Extender: Danielle Williamson in Treatment: 0 Verbal / Phone Orders: No Diagnosis Coding Wound Cleansing Wound #1 Right,Proximal,Midline Lower Leg o Clean wound with Normal Saline. Wound #2 Right,Distal,Midline Lower Leg o Clean wound with Normal Saline. Anesthetic (add to Medication List) Wound #1 Right,Proximal,Midline Lower Leg o Topical Lidocaine 4% cream applied to wound bed prior to debridement (In Clinic Only). Wound #2 Right,Distal,Midline Lower Leg o Topical Lidocaine 4% cream applied to wound bed prior to debridement (In Clinic Only). Primary Wound Dressing Wound #1 Right,Proximal,Midline Lower Leg o Mepitel One Contact layer Wound #2 Right,Distal,Midline Lower Leg o Mepitel One Contact layer Secondary Dressing Wound #1 Right,Proximal,Midline Lower Leg o ABD pad Wound #2 Right,Distal,Midline Lower Leg o ABD pad Dressing Change Frequency Wound #1 Right,Proximal,Midline Lower Leg o Change dressing every week Wound #2 Right,Distal,Midline Lower Leg o Change dressing every week Follow-up Appointments o Return Appointment in 2 weeks. Edema Control o 3 Layer Compression System - Right Lower Extremity Home Health Wound #1 Right,Proximal,Midline Lower Leg o [facility] Danielle Williamson, Danielle Williamson ([id number]) o [place] Nurse may visit PRN to address patientos wound care needs. o FACE TO FACE ENCOUNTER: MEDICARE and MEDICAID PATIENTS: I certify that this patient is under my care and that I had a face-to-face encounter that meets the physician face-to-face encounter requirements with this patient on this date. The encounter with the patient was in whole or in part for the following MEDICAL CONDITION: (primary reason for [place]) MEDICAL NECESSITY: I certify, that based on my findings, NURSING services are a medically necessary home health service. HOME BOUND STATUS: I certify that my clinical findings support that this patient is homebound (i.e., Due to illness or injury, pt requires aid of supportive devices such as crutches, cane, wheelchairs, walkers, the use  of special transportation or the assistance of another person to leave their place of residence. There is a normal inability to leave the home and doing so requires considerable and taxing effort. Other absences are for medical reasons / religious services and are infrequent or of short duration when for other reasons). o If current dressing causes regression in wound condition, may D/C ordered dressing product/s and apply Normal Saline Moist Dressing daily until next [place] / Other MD appointment. [place] of regression in wound condition at [phone]. o Please direct any NON-WOUND related issues/requests for orders to patient's Primary Care Physician Wound #2 [place] for [place] Nurse may visit PRN to address patientos wound care needs. o FACE TO FACE ENCOUNTER: MEDICARE and MEDICAID PATIENTS: I certify that this patient is under my care and that I had a face-to-face encounter that meets the physician face-to-face encounter requirements  with this patient on this date. The encounter with the patient was in whole or in part for the following MEDICAL CONDITION: (primary reason for Williamson) MEDICAL NECESSITY: I certify, that based on my findings, NURSING services are a medically necessary home health service. HOME BOUND STATUS: I certify that my clinical findings support that this patient is homebound (i.e., Due to illness or injury, pt requires aid of supportive devices such as crutches, cane, wheelchairs, walkers, the use of special transportation or the assistance of another person to leave their place of residence. There is a normal inability to leave the home and doing so requires considerable and taxing effort. Other absences are for medical reasons / religious services and are infrequent or of short duration when for other reasons). o If current dressing causes regression in wound condition, may D/C ordered dressing product/s and apply Normal Saline Moist Dressing daily until next [place] / Other MD appointment. [place] of regression in wound condition at [phone]. o Please direct any NON-WOUND related issues/requests for orders to patient's Primary Care Physician Electronic Signature(s) Signed: 10/07/2017 4:44:52 PM By: Danielle Fudge MD, FACS Signed: 10/07/2017 4:57:20 PM By: Danielle Williamson Entered By: Danielle Williamson on 10/07/2017 16:09:47 Danielle Williamson, Danielle Williamson ([id number]) -------------------------------------------------------------------------------- Problem List Details Patient Name: Danielle Williamson Date of Service: 10/07/2017 2:30 PM Medical Record Number: [id number] Patient Account Number: [id number] Date of Birth/Sex: 06/09/1932 (83 y.o. Female) Treating RN: Danielle Williamson Primary Care Provider: Harrel Williamson Other Clinician: Referring Provider: Merlyn Williamson Treating Provider/Extender: Danielle Williamson in Treatment: 0 Active  Problems ICD-10 Encounter Code Description Active Date Diagnosis S81.811A Laceration without foreign body, right lower leg, initial encounter 10/07/2017 Yes L97.212 Non-pressure chronic ulcer of right calf with fat layer exposed 10/07/2017 Yes I89.0 Lymphedema, not elsewhere classified 10/07/2017 Yes Inactive Problems Resolved Problems Electronic Signature(s) Signed: 10/07/2017 4:31:47 PM By: Danielle Fudge MD, FACS Entered By: Danielle Williamson on 10/07/2017 16:31:47 Danielle Williamson, Danielle Williamson ([id number]) -------------------------------------------------------------------------------- Progress Note Details Patient Name: Danielle Williamson Date of Service: 10/07/2017 2:30 PM Medical Record Number: [id number] Patient Account Number: [id number] Date of Birth/Sex: 07-02-1932 (83 y.o. Female) Treating RN: Danielle Williamson Primary Care Provider: Harrel Williamson Other Clinician: Referring Provider: Merlyn Williamson Treating Provider/Extender: Danielle Williamson in Treatment: 0 Subjective Chief Complaint Information obtained from Patient Patient seen for complaints of Non-Healing Wound to the right lower extremity which was caused by an accidental fall about a week ago History of Present Illness (HPI) The following HPI elements were  documented for the patient's wound: Location: right lower leg Quality: Patient reports experiencing a dull pain to affected area(s). Severity: Patient states wound are getting worse. Duration: Patient has had the wound for < 2 weeks prior to presenting for treatment Timing: Pain in wound is constant (hurts all the time) Context: The wound occurred when the patient had a fall and had significant skin tears and went to the ER Modifying Factors: Other treatment(s) tried include:the skin was placed back and Steri-Stripped Associated Signs and Symptoms: Patient reports having increase swelling. 83 year old patient has been seen recently by Dr. Hortencia Pilar of [facility] for leg pain and swelling associated with venous ulceration. She had been seen and treated with Unna boot therapy and the swelling got worse and there was discoloration of the leg. he recommended continuing compression stockings and elevation and exercise and he would not do any surgery at the present time. She should also continue with a lymphedema pump. he also noted evidence of arthrosclerosis of the lower extremity with claudication and noninvasive studies do not suggest clinically significant change. Noninvasive studies, angiogram or surgery at this time. He recommended continuing walking and more formal exercise program. The arterial studies done in September of this year showed a normal ABI bilaterally with the right being 1.03 and the left being 1.14 with triphasic flow bilaterally. She had a DVT study done in August 2018 but I do not see any note off of venous reflux study being done. past medical history significant for COPD, depression, hypertension, panic attacks, peripheral vascular disease, status post cholecystectomy, colonoscopy, hysterectomy and right fourth toe surgery.she quit smoking about 4 years ago and does not drink alcohol. Most recently she was seen on 10/02/2017 after a fall and a skin tear on her right leg with no evidence of fracture.the skin tear was approximated with Steri-Strips and a Xeroform gauze dressing and a tetanus toxoid is up-to-date. Wound History Patient presents with 3 open wounds that have been present for approximately 6 days. Laboratory tests have not been performed in the last month. Patient reportedly has not tested positive for an antibiotic resistant organism. Patient reportedly has not tested positive for osteomyelitis. Patient reportedly has had testing performed to evaluate circulation in the legs. Patient experiences the following problems associated with their wounds: swelling. Patient History Information obtained from  Patient. Allergies epinephrine, furosemide, Lopid, lovastatin, Neosporin (neo-bac-polym), Polysporin, sertraline, iodine, moxifloxacin, penicillin V, sulfa, Vesicare Featherly, Malani H. ([id number]) Family History Cancer - Father, Heart Disease - Mother, Hypertension - Mother, Kidney Disease - Father, Stroke - Paternal Grandparents, Thyroid Problems - Father, No family history of Diabetes, Lung Disease, Seizures, Tuberculosis. Social History Former smoker - 4 years ago, Marital Status - Widowed, Alcohol Use - Rarely, Drug Use - No History, Caffeine Use - Daily. Medical History Eyes Patient has history of Cataracts - surgery in past Denies history of Glaucoma, Optic Neuritis Ear/Nose/Mouth/Throat Denies history of Chronic sinus problems/congestion, Middle ear problems Hematologic/Lymphatic Patient has history of Anemia, Lymphedema Denies history of Hemophilia, Human Immunodeficiency Virus, Sickle Cell Disease Respiratory Patient has history of Asthma, Chronic Obstructive Pulmonary Disease (COPD) Denies history of Aspiration, Pneumothorax, Sleep Apnea, Tuberculosis Cardiovascular Patient has history of Arrhythmia Denies history of Angina, Congestive Heart Failure, Coronary Artery Disease, Deep Vein Thrombosis, Hypertension, Hypotension, Myocardial Infarction, Peripheral Arterial Disease, Peripheral Venous Disease, Phlebitis, Vasculitis Gastrointestinal Denies history of Cirrhosis , Colitis, Crohn s, Hepatitis A, Hepatitis B, Hepatitis C Endocrine Denies history of Type I Diabetes, Type II  Diabetes Genitourinary Denies history of End Stage Renal Disease Immunological Denies history of Lupus Erythematosus, Raynaud s, Scleroderma Integumentary (Skin) Denies history of History of Burn, History of pressure wounds Musculoskeletal Patient has history of Rheumatoid Arthritis Denies history of Gout, Osteoarthritis, Osteomyelitis Neurologic Denies history of Dementia, Neuropathy,  Quadriplegia, Paraplegia, Seizure Disorder Oncologic Denies history of Received Chemotherapy, Received Radiation Psychiatric Denies history of Anorexia/bulimia, Confinement Anxiety Review of Systems (ROS) Constitutional Symptoms (General Health) Denies complaints or symptoms of Fatigue, Fever, Chills. Eyes Complains or has symptoms of Dry Eyes, Glasses / Contacts - glasses. Ear/Nose/Mouth/Throat Denies complaints or symptoms of Difficult clearing ears, Sinusitis. Hematologic/Lymphatic Denies complaints or symptoms of Bleeding / Clotting Disorders. Respiratory Complains or has symptoms of Shortness of Breath. Denies complaints or symptoms of Chronic or frequent coughs. Cardiovascular Complains or has symptoms of LE edema. Denies complaints or symptoms of Chest pain. Danielle Williamson, Danielle H. ([id number]) Gastrointestinal Denies complaints or symptoms of Frequent diarrhea, Nausea, Vomiting. Endocrine Complains or has symptoms of Thyroid disease. Denies complaints or symptoms of Hepatitis, Polydypsia (Excessive Thirst). Genitourinary Denies complaints or symptoms of Kidney failure/ Dialysis, Incontinence/dribbling. Immunological Denies complaints or symptoms of Hives, Itching. Integumentary (Skin) Complains or has symptoms of Wounds, Bleeding or bruising tendency, Breakdown, Swelling. Musculoskeletal Complains or has symptoms of Muscle Pain, Muscle Weakness. Neurologic Complains or has symptoms of Focal/Weakness - weakness in feet. Denies complaints or symptoms of Numbness/parasthesias. Oncologic The patient has no complaints or symptoms. Psychiatric Complains or has symptoms of Anxiety - with SOB. Denies complaints or symptoms of Claustrophobia. Medications: albuterol, Xanax, amlodipine, Lipitor, calcium supplements, Colace, Lasix, Synthroid, Toprol-XL Objective Constitutional Pulse regular. Respirations normal and unlabored. Afebrile. Vitals Time Taken: 2:46 AM, Temperature: 98.4  F, Pulse: 74 bpm, Respiratory Rate: 18 breaths/min, Blood Pressure: 143/76 mmHg. Eyes Nonicteric. Reactive to light. Ears, Nose, Mouth, and Throat Lips, teeth, and gums WNL.Marland Kitchen Moist mucosa without lesions. Neck supple and nontender. No palpable supraclavicular or cervical adenopathy. Normal sized without goiter. Respiratory WNL. No retractions.. Cardiovascular Pedal Pulses WNL. recent ABIs done at the vascular office were noted to be normal. No clubbing, cyanosis or edema. Chest Breasts symmetical and no nipple discharge.. Breast tissue WNL, no masses, lumps, or tenderness.Marland Kitchen Strader, Disney H. ([id number]) Gastrointestinal (GI) Abdomen without masses or tenderness.. No liver or spleen enlargement or tenderness.. Lymphatic No adneopathy. No adenopathy. No adenopathy. Musculoskeletal Adexa without tenderness or enlargement.. Digits and nails w/o clubbing, cyanosis, infection, petechiae, ischemia, or inflammatory conditions.Marland Kitchen Psychiatric Judgement and insight Intact.. No evidence of depression, anxiety, or agitation.. General Notes: she has bilateral lymphedema the right is now well controlled with the compression and she has been wearing compression stockings bilaterally. She also uses lymphedema pumps. The lacerated wound has very friable skin which has been pulled back in place and we will not debride it today. Mepitel, an ABD and a 3 layer Profore will be kept in place. Integumentary (Hair, Skin) No suspicious lesions. No crepitus or fluctuance. No peri-wound warmth or erythema. No masses.. Wound #1 status is Open. Original cause of wound was Trauma. The wound is located on the Right,Proximal,Midline Lower Leg. The wound measures 5.6cm length x 2.1cm width x 0.1cm depth; 9.236cm^2 area and 0.924cm^3 volume. There is Fat Layer (Subcutaneous Tissue) Exposed exposed. There is no tunneling or undermining noted. There is a large amount of serosanguineous drainage noted. The wound margin is  flat and intact. There is medium (34-66%) red, pink granulation within the wound bed. There is a medium (34-66%) amount of necrotic tissue  within the wound bed including Adherent Slough. The periwound skin appearance exhibited: Excoriation, Induration, Scarring, Maceration, Erythema. The periwound skin appearance did not exhibit: Callus, Crepitus, Rash, Dry/Scaly, Atrophie Blanche, Cyanosis, Ecchymosis, Hemosiderin Staining, Mottled, Pallor, Rubor. The surrounding wound skin color is noted with erythema which is circumferential. Wound #2 status is Open. Original cause of wound was Trauma. The wound is located on the Right,Distal,Midline Lower Leg. The wound measures 4.1cm length x 1.2cm width x 0.1cm depth; 3.864cm^2 area and 0.386cm^3 volume. There is Fat Layer (Subcutaneous Tissue) Exposed exposed. There is no tunneling or undermining noted. There is a large amount of serosanguineous drainage noted. The wound margin is flat and intact. There is medium (34-66%) red, pink granulation within the wound bed. There is a medium (34-66%) amount of necrotic tissue within the wound bed including Adherent Slough. The periwound skin appearance exhibited: Excoriation, Induration, Scarring, Maceration, Erythema. The periwound skin appearance did not exhibit: Callus, Crepitus, Rash, Dry/Scaly, Atrophie Blanche, Cyanosis, Ecchymosis, Hemosiderin Staining, Mottled, Pallor, Rubor. The surrounding wound skin color is noted with erythema which is circumferential. Assessment Active Problems ICD-10 S81.811A - Laceration without foreign body, right lower leg, initial encounter L97.212 - Non-pressure chronic ulcer of right calf with fat layer exposed I89.0 - Lymphedema, not elsewhere classified RYLINN, LINZY ([id number]) 83 year old patient with a lacerated skin tear both on the upper and lower shin on the right leg, has had underlying lymphedema with probably venous stasis ulceration in the past. She does not have  peripheral vascular disease and after review today I have recommended: 1. Mepitel, ABDs and a 3 layer Profore to be left in place for the week 2. Due to the Christmas holiday we will have home health come and change the dressing next week and we will see her the week after 3. Elevation and exercise 4. Continue with all supportive care of wearing her lymphedema pumps and compression stockings on the other leg She and her caregiver have read all questions answered to their satisfaction Plan Wound Cleansing: Wound #1 Right,Proximal,Midline Lower Leg: Clean wound with Normal Saline. Wound #2 Right,Distal,Midline Lower Leg: Clean wound with Normal Saline. Anesthetic (add to Medication List): Wound #1 Right,Proximal,Midline Lower Leg: Topical Lidocaine 4% cream applied to wound bed prior to debridement (In Clinic Only). Wound #2 Right,Distal,Midline Lower Leg: Topical Lidocaine 4% cream applied to wound bed prior to debridement (In Clinic Only). Primary Wound Dressing: Wound #1 Right,Proximal,Midline Lower Leg: Mepitel One Contact layer Wound #2 Right,Distal,Midline Lower Leg: Mepitel One Contact layer Secondary Dressing: Wound #1 Right,Proximal,Midline Lower Leg: ABD pad Wound #2 Right,Distal,Midline Lower Leg: ABD pad Dressing Change Frequency: Wound #1 Right,Proximal,Midline Lower Leg: Change dressing every week Wound #2 Right,Distal,Midline Lower Leg: Change dressing every week Follow-up Appointments: Return Appointment in 2 weeks. Edema Control: 3 Layer Compression System - Right Lower Extremity Home Health: Wound #1 Right,Proximal,Midline Lower Leg: [place] for [place] Nurse may visit PRN to address patient s wound care needs. FACE TO FACE ENCOUNTER: MEDICARE and MEDICAID PATIENTS: I certify that this patient is under my care and that I had a face-to-face encounter that meets the physician face-to-face encounter  requirements with this patient on this date. The encounter with the patient was in whole or in part for the following MEDICAL CONDITION: (primary reason for [place]) MEDICAL NECESSITY: I certify, that based on my findings, NURSING services are a medically necessary home health service. HOME BOUND STATUS: I certify that my clinical findings  support that this patient is homebound (i.e., Due to illness or injury, pt requires aid of supportive devices such as crutches, cane, wheelchairs, walkers, the use of special transportation or the assistance of another person to leave their place of residence. There is a normal inability to leave the home and doing so requires considerable and taxing effort. Other absences are for medical reasons / religious services and are infrequent or of short duration when for other reasons). If current dressing causes regression in wound condition, may D/C ordered dressing product/s and apply Normal Saline Danielle Williamson, Danielle H. ([id number]) Moist Dressing daily until next [place] / Other MD appointment. [place] of regression in wound condition at [phone]. Please direct any NON-WOUND related issues/requests for orders to patient's Primary Care Physician Wound #2 Right,Distal,Midline Lower Leg: [place] for [place] Nurse may visit PRN to address patient s wound care needs. FACE TO FACE ENCOUNTER: MEDICARE and MEDICAID PATIENTS: I certify that this patient is under my care and that I had a face-to-face encounter that meets the physician face-to-face encounter requirements with this patient on this date. The encounter with the patient was in whole or in part for the following MEDICAL CONDITION: (primary reason for [place]) MEDICAL NECESSITY: I certify, that based on my findings, NURSING services are a medically necessary home health service. HOME BOUND STATUS: I  certify that my clinical findings support that this patient is homebound (i.e., Due to illness or injury, pt requires aid of supportive devices such as crutches, cane, wheelchairs, walkers, the use of special transportation or the assistance of another person to leave their place of residence. There is a normal inability to leave the home and doing so requires considerable and taxing effort. Other absences are for medical reasons / religious services and are infrequent or of short duration when for other reasons). If current dressing causes regression in wound condition, may D/C ordered dressing product/s and apply Normal Saline Moist Dressing daily until next [place] / Other MD appointment. [place] of regression in wound condition at [phone]. Please direct any NON-WOUND related issues/requests for orders to patient's Primary Care Physician 83 year old patient with a lacerated skin tear both on the upper and lower shin on the right leg, has had underlying lymphedema with probably venous stasis ulceration in the past. She does not have peripheral vascular disease and after review today I have recommended: 1. Mepitel, ABDs and a 3 layer Profore to be left in place for the week 2. Due to the Christmas holiday we will have home health come and change the dressing next week and we will see her the week after 3. Elevation and exercise 4. Continue with all supportive care of wearing her lymphedema pumps and compression stockings on the other leg She and her caregiver have read all questions answered to their satisfaction Electronic Signature(s) Signed: 10/11/2017 11:14:16 AM By: Gretta Cool, BSN, RN, CWS, Kim RN, BSN Signed: 10/17/2017 8:11:14 AM By: Danielle Fudge MD, FACS Previous Signature: 10/07/2017 4:37:27 PM Version By: Danielle Fudge MD, FACS Entered By: Gretta Cool, BSN, RN, CWS, Kim on 10/11/2017 11:14:15 Danielle Williamson, Danielle Williamson  ([id number]) -------------------------------------------------------------------------------- ROS/PFSH Details Patient Name: Danielle Williamson Date of Service: 10/07/2017 2:30 PM Medical Record Number: [id number] Patient Account Number: [id number] Date of Birth/Sex: 08-08-1932 (83 y.o. Female) Treating RN: Danielle Williamson Primary Care Provider: Harrel Williamson Other Clinician: Referring Provider: Merlyn Williamson Treating Provider/Extender: Danielle Williamson in Treatment:  0 Information Obtained From Patient Wound History Do you currently have one or more open woundso Yes How many open wounds do you currently haveo 3 Approximately how long have you had your woundso 6 days Has your wound(s) ever healed and then re-openedo No Have you had any lab work done in the past montho No Have you tested positive for an antibiotic resistant organism (MRSA, VRE)o No Have you tested positive for osteomyelitis (bone infection)o No Have you had any tests for circulation on your legso Yes Who ordered the testo Dr. Arelia Sneddon Where was the test doneo few months ago Have you had other problems associated with your woundso Swelling Constitutional Symptoms (General Health) Complaints and Symptoms: Negative for: Fatigue; Fever; Chills Eyes Complaints and Symptoms: Positive for: Dry Eyes; Glasses / Contacts - glasses Medical History: Positive for: Cataracts - surgery in past Negative for: Glaucoma; Optic Neuritis Ear/Nose/Mouth/Throat Complaints and Symptoms: Negative for: Difficult clearing ears; Sinusitis Medical History: Negative for: Chronic sinus problems/congestion; Middle ear problems Hematologic/Lymphatic Complaints and Symptoms: Negative for: Bleeding / Clotting Disorders Medical History: Positive for: Anemia; Lymphedema Negative for: Hemophilia; Human Immunodeficiency Virus; Sickle Cell Disease Respiratory Danielle Williamson, Danielle H. ([id number]) Complaints and Symptoms: Positive for: Shortness of  Breath Negative for: Chronic or frequent coughs Medical History: Positive for: Asthma; Chronic Obstructive Pulmonary Disease (COPD) Negative for: Aspiration; Pneumothorax; Sleep Apnea; Tuberculosis Cardiovascular Complaints and Symptoms: Positive for: LE edema Negative for: Chest pain Medical History: Positive for: Arrhythmia Negative for: Angina; Congestive Heart Failure; Coronary Artery Disease; Deep Vein Thrombosis; Hypertension; Hypotension; Myocardial Infarction; Peripheral Arterial Disease; Peripheral Venous Disease; Phlebitis; Vasculitis Gastrointestinal Complaints and Symptoms: Negative for: Frequent diarrhea; Nausea; Vomiting Medical History: Negative for: Cirrhosis ; Colitis; Crohnos; Hepatitis A; Hepatitis B; Hepatitis C Endocrine Complaints and Symptoms: Positive for: Thyroid disease Negative for: Hepatitis; Polydypsia (Excessive Thirst) Medical History: Negative for: Type I Diabetes; Type II Diabetes Genitourinary Complaints and Symptoms: Negative for: Kidney failure/ Dialysis; Incontinence/dribbling Medical History: Negative for: End Stage Renal Disease Immunological Complaints and Symptoms: Negative for: Hives; Itching Medical History: Negative for: Lupus Erythematosus; Raynaudos; Scleroderma Integumentary (Skin) Complaints and Symptoms: Positive for: Wounds; Bleeding or bruising tendency; Breakdown; Swelling Medical History: Negative for: History of Burn; History of pressure wounds Danielle Williamson, Danielle H. ([id number]) Musculoskeletal Complaints and Symptoms: Positive for: Muscle Pain; Muscle Weakness Medical History: Positive for: Rheumatoid Arthritis Negative for: Gout; Osteoarthritis; Osteomyelitis Neurologic Complaints and Symptoms: Positive for: Focal/Weakness - weakness in feet Negative for: Numbness/parasthesias Medical History: Negative for: Dementia; Neuropathy; Quadriplegia; Paraplegia; Seizure Disorder Psychiatric Complaints and  Symptoms: Positive for: Anxiety - with SOB Negative for: Claustrophobia Medical History: Negative for: Anorexia/bulimia; Confinement Anxiety Oncologic Complaints and Symptoms: No Complaints or Symptoms Medical History: Negative for: Received Chemotherapy; Received Radiation HBO Extended History Items Eyes: Cataracts Immunizations Pneumococcal Vaccine: Received Pneumococcal Vaccination: Yes Implantable Devices Family and Social History Cancer: Yes - Father; Diabetes: No; Heart Disease: Yes - Mother; Hypertension: Yes - Mother; Kidney Disease: Yes - Father; Lung Disease: No; Seizures: No; Stroke: Yes - Paternal Grandparents; Thyroid Problems: Yes - Father; Tuberculosis: No; Former smoker - 4 years ago; Marital Status - Widowed; Alcohol Use: Rarely; Drug Use: No History; Caffeine Use: Daily; Financial Concerns: No; Food, Clothing or Williamson Needs: No; Support System Lacking: No; Transportation Concerns: No; Advanced Directives: Yes (Not Provided); Patient does not want information on Advanced Directives; Do not resuscitate: Yes (Not Provided); Living Will: Yes (Not Provided); Medical Power of Attorney: Yes (Not Provided) Physician Affirmation I have reviewed and agree with the  above information. Electronic Signature(s) TRYNITY, SKOUSEN ([id number]) Signed: 10/07/2017 4:44:52 PM By: Danielle Fudge MD, FACS Signed: 10/07/2017 4:57:20 PM By: Danielle Williamson Entered By: Danielle Williamson on 10/07/2017 15:07:41 TALASIA, SAULTER ([id number]) -------------------------------------------------------------------------------- SuperBill Details Patient Name: Danielle Williamson Date of Service: 10/07/2017 Medical Record Number: [id number] Patient Account Number: [id number] Date of Birth/Sex: [birth year] (83 y.o. Female) Treating RN: Danielle Williamson Primary Care Provider: Harrel Williamson Other Clinician: Referring Provider: Merlyn Williamson Treating Provider/Extender: Danielle Williamson in  Treatment: 0 Diagnosis Coding ICD-10 Codes Code Description [phone] Laceration without foreign body, right lower leg, initial encounter L97.212 Non-pressure chronic ulcer of right calf with fat layer exposed I89.0 Lymphedema, not elsewhere classified Facility Procedures CPT4 Code: 82081388 Description: 99214 - WOUND CARE VISIT-LEV 4 EST PT Modifier: Quantity: 1 Physician Procedures CPT4 Code: 7195974 Description: 71855 - WC PHYS LEVEL 4 - NEW PT ICD-10 Diagnosis Description S81.811A Laceration without foreign body, right lower leg, initial en L97.212 Non-pressure chronic ulcer of right calf with fat layer expo I89.0 Lymphedema, not elsewhere  classified Modifier: counter sed Quantity: 1 Electronic Signature(s) Signed: 10/07/2017 4:37:43 PM By: Danielle Fudge MD, FACS Entered By: Danielle Williamson on 10/07/2017 16:37:42

## 2017-10-08 NOTE — Progress Notes (Signed)
Danielle Williamson, Danielle Williamson ([id number]) Visit Report for 10/07/2017 Abuse/Suicide Risk Screen Details Patient Name: Danielle Williamson, Danielle Williamson Date of Service: 10/07/2017 2:30 PM Medical Record Number: [id number] Patient Account Number: [id number] Date of Birth/Sex: Mar 08, 1932 (83 y.o. Female) Treating RN: Roger Shelter Primary Care Demica Zook: Harrel Lemon Other Clinician: Referring Izic Stfort: Merlyn Lot Treating Koree Schopf/Extender: Frann Rider in Treatment: 0 Abuse/Suicide Risk Screen Items Answer ABUSE/SUICIDE RISK SCREEN: Has anyone close to you tried to hurt or harm you recentlyo No Do you feel uncomfortable with anyone in your familyo No Has anyone forced you do things that you didnot want to doo No Do you have any thoughts of harming yourselfo No Patient displays signs or symptoms of abuse and/or neglect. No Electronic Signature(s) Signed: 10/07/2017 4:57:20 PM By: Roger Shelter Entered By: Roger Shelter on 10/07/2017 14:58:28 Pileggi, [name] ([id number]) -------------------------------------------------------------------------------- Activities of Daily Living Details Patient Name: Danielle Williamson Date of Service: 10/07/2017 2:30 PM Medical Record Number: [id number] Patient Account Number: [id number] Date of Birth/Sex: 01/23/1932 (83 y.o. Female) Treating RN: Roger Shelter Primary Care Eleasha Cataldo: Harrel Lemon Other Clinician: Referring Jacy Howat: Merlyn Lot Treating Daisy Lites/Extender: Frann Rider in Treatment: 0 Activities of Daily Living Items Answer Activities of Daily Living (Please select one for each item) Drive Automobile Need Assistance Take Medications Need Assistance Use Telephone Need Assistance Care for Appearance Need Assistance Use Toilet Need Assistance Bath / Shower Need Assistance Dress Self Need Assistance Feed Self Need Assistance Walk Need Assistance Get In / Out Bed Need Assistance Housework Need Assistance Prepare Meals  Need Assistance Handle Money Need Assistance Shop for Self Need Assistance Electronic Signature(s) Signed: 10/07/2017 4:57:20 PM By: Roger Shelter Entered By: Roger Shelter on 10/07/2017 14:59:27 Risko, [name] ([id number]) -------------------------------------------------------------------------------- Education Assessment Details Patient Name: Danielle Williamson Date of Service: 10/07/2017 2:30 PM Medical Record Number: [id number] Patient Account Number: [id number] Date of Birth/Sex: [birth year] (83 y.o. Female) Treating RN: Roger Shelter Primary Care Monea Pesantez: Harrel Lemon Other Clinician: Referring Sherrilynn Gudgel: Merlyn Lot Treating Ananda Caya/Extender: Frann Rider in Treatment: 0 Primary Learner Assessed: Patient Learning Preferences/Education Level/Primary Language Learning Preference: Explanation Highest Education Level: High School Preferred Language: English Cognitive Barrier Assessment/Beliefs Language Barrier: No Translator Needed: No Memory Deficit: No Emotional Barrier: No Cultural/Religious Beliefs Affecting Medical Care: No Physical Barrier Assessment Impaired Vision: Yes Glasses Impaired Hearing: No Decreased Hand dexterity: No Knowledge/Comprehension Assessment Knowledge Level: High Comprehension Level: High Ability to understand written High instructions: Ability to understand verbal High instructions: Motivation Assessment Anxiety Level: Calm Cooperation: Cooperative Education Importance: Acknowledges Need Interest in Health Problems: Asks Questions Perception: Coherent Willingness to Engage in Self- High Management Activities: Readiness to Engage in Self- High Management Activities: Electronic Signature(s) Signed: 10/07/2017 4:57:20 PM By: Roger Shelter Entered By: Roger Shelter on 10/07/2017 15:00:01 Bricco, [name] ([id number]) -------------------------------------------------------------------------------- Fall  Risk Assessment Details Patient Name: Danielle Williamson Date of Service: 10/07/2017 2:30 PM Medical Record Number: [id number] Patient Account Number: [id number] Date of Birth/Sex: [birth year] (83 y.o. Female) Treating RN: Roger Shelter Primary Care Ryana Montecalvo: Harrel Lemon Other Clinician: Referring Sumner Kirchman: Merlyn Lot Treating Jun Rightmyer/Extender: Frann Rider in Treatment: 0 Fall Risk Assessment Items Have you had 2 or more falls in the last 12 monthso 0 No Have you had any fall that resulted in injury in the last 12 monthso 0 Yes FALL RISK ASSESSMENT: History of falling - immediate or within 3 months 25 Yes Secondary diagnosis 0 No Ambulatory aid None/bed rest/wheelchair/nurse 0 Yes Crutches/cane/walker 0 No Furniture 0 No IV Access/Saline Lock  0 No Gait/Training Normal/bed rest/immobile 0 No Weak 0 No Impaired 0 No Mental Status Oriented to own ability 0 No Electronic Signature(s) Signed: 10/07/2017 4:57:20 PM By: Roger Shelter Entered By: Roger Shelter on 10/07/2017 15:00:40 Ludwig, [name] ([id number]) -------------------------------------------------------------------------------- Foot Assessment Details Patient Name: Danielle Williamson Date of Service: 10/07/2017 2:30 PM Medical Record Number: [id number] Patient Account Number: [id number] Date of Birth/Sex: [birth year] (83 y.o. Female) Treating RN: Roger Shelter Primary Care Fredderick Swanger: Harrel Lemon Other Clinician: Referring Preslie Depasquale: Merlyn Lot Treating Marcee Jacobs/Extender: Frann Rider in Treatment: 0 Foot Assessment Items Site Locations + = Sensation present, - = Sensation absent, C = Callus, U = Ulcer R = Redness, W = Warmth, M = Maceration, PU = Pre-ulcerative lesion F = Fissure, S = Swelling, D = Dryness Assessment Right: Left: Other Deformity: No No Prior Foot Ulcer: No No Prior Amputation: No No Charcot Joint: No No Ambulatory Status: Ambulatory Without Help Gait:  Steady Electronic Signature(s) Signed: 10/07/2017 4:57:20 PM By: Roger Shelter Entered By: Roger Shelter on 10/07/2017 15:09:45 Cruces, [name] ([id number]) -------------------------------------------------------------------------------- Nutrition Risk Assessment Details Patient Name: Danielle Williamson Date of Service: 10/07/2017 2:30 PM Medical Record Number: [id number] Patient Account Number: [id number] Date of Birth/Sex: [birth year] (83 y.o. Female) Treating RN: Roger Shelter Primary Care Lukas Pelcher: Harrel Lemon Other Clinician: Referring Jamesia Linnen: Merlyn Lot Treating Afton Lavalle/Extender: Frann Rider in Treatment: 0 Height (in): Weight (lbs): Body Mass Index (BMI): Nutrition Risk Assessment Items NUTRITION RISK SCREEN: I have an illness or condition that made me change the kind and/or amount of 0 No food I eat I eat fewer than two meals per day 0 No I eat few fruits and vegetables, or milk products 0 No I have three or more drinks of beer, liquor or wine almost every day 0 No I have tooth or mouth problems that make it hard for me to eat 0 No I don't always have enough money to buy the food I need 0 No I eat alone most of the time 0 No I take three or more different prescribed or over-the-counter drugs a day 0 No Without wanting to, I have lost or gained 10 pounds in the last six months 0 No I am not always physically able to shop, cook and/or feed myself 0 No Nutrition Protocols Good Risk Protocol 0 No interventions needed Moderate Risk Protocol Electronic Signature(s) Signed: 10/07/2017 4:57:20 PM By: Roger Shelter Entered By: Roger Shelter on 10/07/2017 15:01:06

## 2017-10-08 NOTE — Progress Notes (Signed)
Danielle Williamson, Danielle Williamson ([id number]) Visit Report for 10/07/2017 Allergy List Details Patient Name: Danielle Williamson, Danielle Williamson Date of Service: 10/07/2017 2:30 PM Medical Record Number: [id number] Patient Account Number: [id number] Date of Birth/Sex: 08/11/1932 (83 y.o. Female) Treating RN: Roger Shelter Primary Care Christobal Morado: Harrel Lemon Other Clinician: Referring Lorali Khamis: Merlyn Lot Treating Tessa Seaberry/Extender: Frann Rider in Treatment: 0 Allergies Active Allergies epinephrine furosemide Lopid lovastatin Neosporin (neo-bac-polym) Polysporin sertraline iodine moxifloxacin penicillin V sulfa [facility] Allergy Notes Electronic Signature(s) Signed: 10/07/2017 3:35:08 PM By: Roger Shelter Entered By: Roger Shelter on 10/07/2017 15:35:07 Danielle Williamson, Danielle Williamson ([id number]) -------------------------------------------------------------------------------- Arrival Information Details Patient Name: Danielle Williamson Date of Service: 10/07/2017 2:30 PM Medical Record Number: [id number] Patient Account Number: [id number] Date of Birth/Sex: 22-Dec-1931 (83 y.o. Female) Treating RN: Roger Shelter Primary Care Cheyenne Schumm: Harrel Lemon Other Clinician: Referring Lonya Johannesen: Merlyn Lot Treating Stefano Trulson/Extender: Frann Rider in Treatment: 0 Visit Information Patient Arrived: Ambulatory Arrival Time: 14:44 Accompanied By: sister Transfer Assistance: None Patient Identification Verified: Yes Secondary Verification Process Completed: Yes Patient Requires Transmission-Based No Precautions: Patient Has Alerts: Yes Patient Alerts: right ABI- 1.21 Left ABI- 1.14 Electronic Signature(s) Signed: 10/07/2017 3:30:49 PM By: Roger Shelter Previous Signature: 10/07/2017 3:30:22 PM Version By: Roger Shelter Entered By: Roger Shelter on 10/07/2017 15:30:49 Danielle Williamson, Danielle Williamson  ([id number]) -------------------------------------------------------------------------------- Clinic Level of Care Assessment Details Patient Name: Danielle Williamson Date of Service: 10/07/2017 2:30 PM Medical Record Number: [id number] Patient Account Number: [id number] Date of Birth/Sex: 09/17/1932 (83 y.o. Female) Treating RN: Roger Shelter Primary Care Yony Roulston: Harrel Lemon Other Clinician: Referring Paiten Boies: Merlyn Lot Treating Talyn Dessert/Extender: Frann Rider in Treatment: 0 Clinic Level of Care Assessment Items TOOL 2 Quantity Score X - Use when only an EandM is performed on the INITIAL visit 1 0 ASSESSMENTS - Nursing Assessment / Reassessment X - General Physical Exam (combine w/ comprehensive assessment (listed just below) when 1 20 performed on new pt. evals) X- 1 25 Comprehensive Assessment (HX, ROS, Risk Assessments, Wounds Hx, etc.) ASSESSMENTS - Wound and Skin Assessment / Reassessment []  - Simple Wound Assessment / Reassessment - one wound 0 X- 2 5 Complex Wound Assessment / Reassessment - multiple wounds []  - 0 Dermatologic / Skin Assessment (not related to wound area) ASSESSMENTS - Ostomy and/or Continence Assessment and Care []  - Incontinence Assessment and Management 0 []  - 0 Ostomy Care Assessment and Management (repouching, etc.) PROCESS - Coordination of Care []  - Simple Patient / Family Education for ongoing care 0 X- 1 20 Complex (extensive) Patient / Family Education for ongoing care []  - 0 Staff obtains Programmer, systems, Records, Test Results / Process Orders []  - 0 Staff telephones HHA, Nursing Homes / Clarify orders / etc []  - 0 Routine Transfer to another Facility (non-emergent condition) []  - 0 Routine Hospital Admission (non-emergent condition) []  - 0 New Admissions / Biomedical engineer / Ordering NPWT, Apligraf, etc. []  - 0 Emergency Hospital Admission (emergent condition) []  - 0 Simple Discharge Coordination X- 1  15 Complex (extensive) Discharge Coordination PROCESS - Special Needs []  - Pediatric / Minor Patient Management 0 []  - 0 Isolation Patient Management Danielle Williamson, Danielle H. ([id number]) []  - 0 Hearing / Language / Visual special needs []  - 0 Assessment of Community assistance (transportation, D/C planning, etc.) []  - 0 Additional assistance / Altered mentation []  - 0 Support Surface(s) Assessment (bed, cushion, seat, etc.) INTERVENTIONS - Wound Cleansing / Measurement X - Wound Imaging (photographs - any number of wounds) 1 5 []  - 0 Wound Tracing (instead of  photographs) []  - 0 Simple Wound Measurement - one wound X- 2 5 Complex Wound Measurement - multiple wounds []  - 0 Simple Wound Cleansing - one wound X- 2 5 Complex Wound Cleansing - multiple wounds INTERVENTIONS - Wound Dressings []  - Small Wound Dressing one or multiple wounds 0 X- 2 15 Medium Wound Dressing one or multiple wounds []  - 0 Large Wound Dressing one or multiple wounds []  - 0 Application of Medications - injection INTERVENTIONS - Miscellaneous []  - External ear exam 0 []  - 0 Specimen Collection (cultures, biopsies, blood, body fluids, etc.) []  - 0 Specimen(s) / Culture(s) sent or taken to Lab for analysis []  - 0 Patient Transfer (multiple staff / Civil Service fast streamer / Similar devices) []  - 0 Simple Staple / Suture removal (25 or less) []  - 0 Complex Staple / Suture removal (26 or more) []  - 0 Hypo / Hyperglycemic Management (close monitor of Blood Glucose) []  - 0 Ankle / Brachial Index (ABI) - do not check if billed separately Has the patient been seen at the hospital within the last three years: Yes Total Score: 145 Level Of Care: New/Established - Level 4 Electronic Signature(s) Signed: 10/07/2017 4:57:20 PM By: Roger Shelter Entered By: Roger Shelter on 10/07/2017 16:25:50 Danielle Williamson, Danielle Williamson ([id number]) -------------------------------------------------------------------------------- Encounter  Discharge Information Details Patient Name: Danielle Williamson Date of Service: 10/07/2017 2:30 PM Medical Record Number: [id number] Patient Account Number: [id number] Date of Birth/Sex: May 25, 1932 (83 y.o. Female) Treating RN: Roger Shelter Primary Care Morocco Gipe: Harrel Lemon Other Clinician: Referring Braydin Aloi: Merlyn Lot Treating Hendryx Ricke/Extender: Frann Rider in Treatment: 0 Encounter Discharge Information Items Discharge Pain Level: 0 Discharge Condition: Stable Ambulatory Status: Ambulatory Discharge Destination: Home Transportation: Private Auto Accompanied By: sister Schedule Follow-up Appointment: Yes Medication Reconciliation completed and No provided to Patient/Care Adriella Essex: Patient Clinical Summary of Care: Declined Electronic Signature(s) Signed: 10/07/2017 4:27:11 PM By: Roger Shelter Entered By: Roger Shelter on 10/07/2017 16:27:11 Danielle Williamson, Danielle Williamson ([id number]) -------------------------------------------------------------------------------- Lower Extremity Assessment Details Patient Name: Danielle Williamson Date of Service: 10/07/2017 2:30 PM Medical Record Number: [id number] Patient Account Number: [id number] Date of Birth/Sex: Apr 02, 1932 (83 y.o. Female) Treating RN: Roger Shelter Primary Care Sherlyn Ebbert: Harrel Lemon Other Clinician: Referring Jamael Hoffmann: Merlyn Lot Treating Raydon Chappuis/Extender: Frann Rider in Treatment: 0 Edema Assessment Assessed: [Left: No] [Right: No] Edema: [Left: N] [Right: o] Vascular Assessment Claudication: Claudication Assessment [Right:Rest Pain] Pulses: Dorsalis Pedis Doppler Audible: [Right:Yes] Posterior Tibial Extremity colors, hair growth, and conditions: Extremity Color: [Right:Hyperpigmented] Hair Growth on Extremity: [Right:No] Temperature of Extremity: [Right:Cool] Capillary Refill: [Right:> 3 seconds] Toe Nail Assessment Left: Right: Thick: Yes Discolored: Yes Deformed:  Yes Improper Length and Hygiene: Yes Electronic Signature(s) Signed: 10/07/2017 3:32:06 PM By: Roger Shelter Entered By: Roger Shelter on 10/07/2017 15:32:06 Danielle Williamson, Danielle Williamson ([id number]) -------------------------------------------------------------------------------- Multi Wound Chart Details Patient Name: Danielle Williamson Date of Service: 10/07/2017 2:30 PM Medical Record Number: [id number] Patient Account Number: [id number] Date of Birth/Sex: 1932-07-07 (83 y.o. Female) Treating RN: Roger Shelter Primary Care Kamorah Nevils: Harrel Lemon Other Clinician: Referring Archie Atilano: Merlyn Lot Treating Lyan Holck/Extender: Frann Rider in Treatment: 0 Vital Signs Height(in): Pulse(bpm): 37 Weight(lbs): Blood Pressure(mmHg): 143/76 Body Mass Index(BMI): Temperature(F): 98.4 Respiratory Rate 18 (breaths/min): Photos: [1:No Photos] [2:No Photos] [N/A:N/A] Wound Location: [1:Right Lower Leg - Midline, Proximal] [2:Right Lower Leg - Midline, Distal] [N/A:N/A] Wounding Event: [1:Trauma] [2:Trauma] [N/A:N/A] Primary Etiology: [1:Trauma, Other] [2:Trauma, Other] [N/A:N/A] Comorbid History: [1:Cataracts, Anemia, Lymphedema, Asthma, Chronic Obstructive Pulmonary Disease (COPD), Arrhythmia, Rheumatoid Arthritis] [2:Cataracts, Anemia, Lymphedema, Asthma, Chronic  Obstructive Pulmonary Disease (COPD), Arrhythmia, Rheumatoid  Arthritis] [N/A:N/A] Date Acquired: [1:10/02/2017] [2:10/02/2017] [N/A:N/A] Weeks of Treatment: [1:0] [2:0] [N/A:N/A] Wound Status: [1:Open] [2:Open] [N/A:N/A] Measurements L x W x D [1:5.6x2.1x0.1] [2:4.1x1.2x0.1] [N/A:N/A] (cm) Area (cm) : [1:9.236] [2:3.864] [N/A:N/A] Volume (cm) : [1:0.924] [2:0.386] [N/A:N/A] Classification: [1:Partial Thickness] [2:Partial Thickness] [N/A:N/A] Exudate Amount: [1:Large] [2:Large] [N/A:N/A] Exudate Type: [1:Serosanguineous] [2:Serosanguineous] [N/A:N/A] Exudate Color: [1:red, brown] [2:red, brown] [N/A:N/A] Wound  Margin: [1:Flat and Intact] [2:Flat and Intact] [N/A:N/A] Granulation Amount: [1:Medium (34-66%)] [2:Medium (34-66%)] [N/A:N/A] Granulation Quality: [1:Red, Pink] [2:Red, Pink] [N/A:N/A] Necrotic Amount: [1:Medium (34-66%)] [2:Medium (34-66%)] [N/A:N/A] Exposed Structures: [1:Fat Layer (Subcutaneous Tissue) Exposed: Yes] [2:Fat Layer (Subcutaneous Tissue) Exposed: Yes] [N/A:N/A] Epithelialization: [1:None] [2:None] [N/A:N/A] Periwound Skin Texture: [1:Excoriation: Yes Induration: Yes Scarring: Yes Callus: No Crepitus: No Rash: No] [2:Excoriation: Yes Induration: Yes Scarring: Yes Callus: No Crepitus: No Rash: No] [N/A:N/A] Periwound Skin Moisture: [1:Maceration: Yes Dry/Scaly: No] [2:Maceration: Yes Dry/Scaly: No] [N/A:N/A] Periwound Skin Color: Erythema: Yes Erythema: Yes N/A Atrophie Blanche: No Atrophie Blanche: No Cyanosis: No Cyanosis: No Ecchymosis: No Ecchymosis: No Hemosiderin Staining: No Hemosiderin Staining: No Mottled: No Mottled: No Pallor: No Pallor: No Rubor: No Rubor: No Erythema Location: Circumferential Circumferential N/A Tenderness on Palpation: No No N/A Wound Preparation: Ulcer Cleansing: Ulcer Cleansing: N/A Rinsed/Irrigated with Saline Rinsed/Irrigated with Saline Topical Anesthetic Applied: Topical Anesthetic Applied: Other: lidocaine 4% Other: lidocaine 4% Treatment Notes Wound #1 (Right, Proximal, Midline Lower Leg) 1. Cleansed with: Clean wound with Normal Saline 2. Anesthetic Topical Lidocaine 4% cream to wound bed prior to debridement 4. Dressing Applied: Mepitel 5. Secondary Dressing Applied ABD Pad 7. Secured with 3 Layer Compression System - Right Lower Extremity Wound #2 (Right, Distal, Midline Lower Leg) 1. Cleansed with: Clean wound with Normal Saline 2. Anesthetic Topical Lidocaine 4% cream to wound bed prior to debridement 4. Dressing Applied: Mepitel 5. Secondary Dressing Applied ABD Pad 7. Secured with 3 Layer  Compression System - Right Lower Extremity Electronic Signature(s) Signed: 10/07/2017 4:31:52 PM By: Christin Fudge MD, FACS Entered By: Christin Fudge on 10/07/2017 16:31:52 Danielle Williamson, Danielle Williamson ([id number]) -------------------------------------------------------------------------------- [place] Details Patient Name: Danielle Williamson Date of Service: 10/07/2017 2:30 PM Medical Record Number: [id number] Patient Account Number: [id number] Date of Birth/Sex: 08/24/1932 (83 y.o. Female) Treating RN: Roger Shelter Primary Care Evann Koelzer: Harrel Lemon Other Clinician: Referring Angeni Chaudhuri: Merlyn Lot Treating Pesach Frisch/Extender: Frann Rider in Treatment: 0 Active Inactive ` Orientation to the Wound Care Program Nursing Diagnoses: Knowledge deficit related to the wound healing center program Goals: Patient/caregiver will verbalize understanding of the [place] Program Date Initiated: 10/07/2017 Target Resolution Date: 11/07/2017 Goal Status: Active Interventions: Provide education on orientation to the wound center Notes: ` Wound/Skin Impairment Nursing Diagnoses: Impaired tissue integrity Knowledge deficit related to ulceration/compromised skin integrity Goals: Patient/caregiver will verbalize understanding of skin care regimen Date Initiated: 10/07/2017 Target Resolution Date: 11/07/2017 Goal Status: Active Ulcer/skin breakdown will have a volume reduction of 30% by week 4 Date Initiated: 10/07/2017 Target Resolution Date: 11/07/2017 Goal Status: Active Interventions: Assess patient/caregiver ability to obtain necessary supplies Assess ulceration(s) every visit Provide education on ulcer and skin care Treatment Activities: Skin care regimen initiated : 10/07/2017 Notes: Electronic Signature(s) Signed: 10/07/2017 4:57:20 PM By: Lisette Abu ([id number]) Entered By: Roger Shelter on 10/07/2017 15:43:53 Seckinger,  Danielle Williamson ([id number]) -------------------------------------------------------------------------------- Pain Assessment Details Patient Name: Danielle Williamson Date of Service: 10/07/2017 2:30 PM Medical Record Number: [id number] Patient Account Number: [id number] Date of Birth/Sex: 1932-06-15 (83 y.o.  Female) Treating RN: Roger Shelter Primary Care Richad Ramsay: Harrel Lemon Other Clinician: Referring Shaarav Ripple: Merlyn Lot Treating Dois Juarbe/Extender: Frann Rider in Treatment: 0 Active Problems Location of Pain Severity and Description of Pain Patient Has Paino Yes Site Locations Pain Location: Pain in Ulcers Duration of the Pain. Constant / Intermittento Constant Rate the pain. Current Pain Level: 7 Character of Pain Describe the Pain: Aching, Burning, Dull Pain Management and Medication Current Pain Management: Electronic Signature(s) Signed: 10/07/2017 4:57:20 PM By: Roger Shelter Entered By: Roger Shelter on 10/07/2017 14:46:27 Danielle Williamson, Danielle Williamson ([id number]) -------------------------------------------------------------------------------- Patient/Caregiver Education Details Patient Name: Danielle Williamson Date of Service: 10/07/2017 2:30 PM Medical Record Number: [id number] Patient Account Number: [id number] Date of Birth/Gender: Apr 24, 1932 (83 y.o. Female) Treating RN: Roger Shelter Primary Care Physician: Harrel Lemon Other Clinician: Referring Physician: Merlyn Lot Treating Physician/Extender: Frann Rider in Treatment: 0 Education Assessment Education Provided To: Patient and Caregiver [facility] referral made and orders faxed Education Topics Provided Welcome [facility]: Handouts: [facility] [place] Methods: Explain/Verbal Responses: State content correctly Wound/Skin Impairment: Handouts: Caring for Your Ulcer Methods: Explain/Verbal Responses: State content correctly Electronic Signature(s) Signed:  10/07/2017 4:57:20 PM By: Roger Shelter Entered By: Roger Shelter on 10/07/2017 16:27:52 Danielle Williamson, Danielle Williamson ([id number]) -------------------------------------------------------------------------------- Wound Assessment Details Patient Name: Danielle Williamson Date of Service: 10/07/2017 2:30 PM Medical Record Number: [id number] Patient Account Number: [id number] Date of Birth/Sex: Feb 08, 1932 (83 y.o. Female) Treating RN: Roger Shelter Primary Care Dazha Kempa: Harrel Lemon Other Clinician: Referring Delanee Xin: Merlyn Lot Treating Durand Wittmeyer/Extender: Frann Rider in Treatment: 0 Wound Status Wound Number: 1 Primary Trauma, Other Etiology: Wound Location: Right Lower Leg - Midline, Proximal Wound Open Wounding Event: Trauma Status: Date Acquired: 10/02/2017 Comorbid Cataracts, Anemia, Lymphedema, Asthma, Weeks Of Treatment: 0 History: Chronic Obstructive Pulmonary Disease Clustered Wound: No (COPD), Arrhythmia, Rheumatoid Arthritis Photos Wound Measurements Length: (cm) 5.6 Width: (cm) 2.1 Depth: (cm) 0.1 Area: (cm) 9.236 Volume: (cm) 0.924 % Reduction in Area: 0% % Reduction in Volume: 0% Epithelialization: None Tunneling: No Undermining: No Wound Description Classification: Partial Thickness Wound Margin: Flat and Intact Exudate Amount: Large Exudate Type: Serosanguineous Exudate Color: red, brown Foul Odor After Cleansing: No Slough/Fibrino Yes Wound Bed Granulation Amount: Medium (34-66%) Exposed Structure Granulation Quality: Red, Pink Fat Layer (Subcutaneous Tissue) Exposed: Yes Necrotic Amount: Medium (34-66%) Necrotic Quality: Adherent Slough Periwound Skin Texture Texture Color No Abnormalities Noted: No No Abnormalities Noted: No Callus: No Atrophie Blanche: No Crepitus: No Cyanosis: No Dollens, Audra H. ([id number]) Excoriation: Yes Ecchymosis: No Induration: Yes Erythema: Yes Rash: No Erythema Location:  Circumferential Scarring: Yes Hemosiderin Staining: No Mottled: No Moisture Pallor: No No Abnormalities Noted: No Rubor: No Dry / Scaly: No Maceration: Yes Wound Preparation Ulcer Cleansing: Rinsed/Irrigated with Saline Topical Anesthetic Applied: Other: lidocaine 4%, Treatment Notes Wound #1 (Right, Proximal, Midline Lower Leg) 1. Cleansed with: Clean wound with Normal Saline 2. Anesthetic Topical Lidocaine 4% cream to wound bed prior to debridement 4. Dressing Applied: Mepitel 5. Secondary Dressing Applied ABD Pad 7. Secured with 3 Layer Compression System - Right Lower Extremity Electronic Signature(s) Signed: 10/07/2017 4:49:46 PM By: Roger Shelter Entered By: Roger Shelter on 10/07/2017 16:49:46 Nogueira, Danielle Williamson ([id number]) -------------------------------------------------------------------------------- Wound Assessment Details Patient Name: Danielle Williamson Date of Service: 10/07/2017 2:30 PM Medical Record Number: [id number] Patient Account Number: [id number] Date of Birth/Sex: October 16, 1932 (83 y.o. Female) Treating RN: Roger Shelter Primary Care Majel Giel: Harrel Lemon Other Clinician: Referring Bronte Sabado: Merlyn Lot Treating Vonette Grosso/Extender: Christin Fudge  Weeks in Treatment: 0 Wound Status Wound Number: 2 Primary Trauma, Other Etiology: Wound Location: Right Lower Leg - Midline, Distal Wound Open Wounding Event: Trauma Status: Date Acquired: 10/02/2017 Comorbid Cataracts, Anemia, Lymphedema, Asthma, Weeks Of Treatment: 0 History: Chronic Obstructive Pulmonary Disease Clustered Wound: No (COPD), Arrhythmia, Rheumatoid Arthritis Photos Wound Measurements Length: (cm) 4.1 Width: (cm) 1.2 Depth: (cm) 0.1 Area: (cm) 3.864 Volume: (cm) 0.386 % Reduction in Area: 0% % Reduction in Volume: 0% Epithelialization: None Tunneling: No Undermining: No Wound Description Classification: Partial Thickness Wound Margin: Flat and  Intact Exudate Amount: Large Exudate Type: Serosanguineous Exudate Color: red, brown Foul Odor After Cleansing: No Slough/Fibrino Yes Wound Bed Granulation Amount: Medium (34-66%) Exposed Structure Granulation Quality: Red, Pink Fat Layer (Subcutaneous Tissue) Exposed: Yes Necrotic Amount: Medium (34-66%) Necrotic Quality: Adherent Slough Periwound Skin Texture Texture Color No Abnormalities Noted: No No Abnormalities Noted: No Callus: No Atrophie Blanche: No Crepitus: No Cyanosis: No Derossett, Tere H. ([id number]) Excoriation: Yes Ecchymosis: No Induration: Yes Erythema: Yes Rash: No Erythema Location: Circumferential Scarring: Yes Hemosiderin Staining: No Mottled: No Moisture Pallor: No No Abnormalities Noted: No Rubor: No Dry / Scaly: No Maceration: Yes Wound Preparation Ulcer Cleansing: Rinsed/Irrigated with Saline Topical Anesthetic Applied: Other: lidocaine 4%, Treatment Notes Wound #2 (Right, Distal, Midline Lower Leg) 1. Cleansed with: Clean wound with Normal Saline 2. Anesthetic Topical Lidocaine 4% cream to wound bed prior to debridement 4. Dressing Applied: Mepitel 5. Secondary Dressing Applied ABD Pad 7. Secured with 3 Layer Compression System - Right Lower Extremity Electronic Signature(s) Signed: 10/07/2017 4:50:06 PM By: Roger Shelter Entered By: Roger Shelter on 10/07/2017 16:50:06 [place]man, Danielle Williamson ([id number]) -------------------------------------------------------------------------------- Vitals Details Patient Name: Danielle Williamson Date of Service: 10/07/2017 2:30 PM Medical Record Number: [id number] Patient Account Number: [id number] Date of Birth/Sex: [birth year] (83 y.o. Female) Treating RN: Roger Shelter Primary Care Kanon Colunga: Harrel Lemon Other Clinician: Referring Gavon Majano: Merlyn Lot Treating Denzil Bristol/Extender: Frann Rider in Treatment: 0 Vital Signs Time Taken: 02:46 Temperature (F): 98.4 Pulse  (bpm): 74 Respiratory Rate (breaths/min): 18 Blood Pressure (mmHg): 143/76 Reference Range: 80 - 120 mg / dl Electronic Signature(s) Signed: 10/07/2017 4:57:20 PM By: Roger Shelter Entered By: Roger Shelter on 10/07/2017 14:46:47

## 2017-10-21 ENCOUNTER — Encounter: Payer: Medicare Other | Admitting: Physician Assistant

## 2017-10-21 DIAGNOSIS — S81811A Laceration without foreign body, right lower leg, initial encounter: Secondary | ICD-10-CM | POA: Diagnosis not present

## 2017-10-23 NOTE — Progress Notes (Signed)
Danielle Williamson, Danielle Williamson ([id number]) Visit Report for 10/21/2017 Chief Complaint Document Details Patient Name: Danielle Williamson, Danielle Williamson Date of Service: 10/21/2017 11:00 AM Medical Record Number: [id number] Patient Account Number: [id number] Date of Birth/Sex: 12-Aug-1932 (84 y.o. Female) Treating RN: Roger Shelter Primary Care Provider: Harrel Lemon Other Clinician: Referring Provider: Harrel Lemon Treating Provider/Extender: Melburn Hake, HOYT Weeks in Treatment: 2 Information Obtained from: Patient Chief Complaint Patient seen for complaints of Non-Healing Wound to the right lower extremity which was caused by an accidental fall Electronic Signature(s) Signed: 10/23/2017 11:24:19 AM By: Worthy Keeler PA-C Entered By: Worthy Keeler on 10/21/2017 11:31:03 Tomey, Danielle Williamson ([id number]) -------------------------------------------------------------------------------- HPI Details Patient Name: Danielle Williamson Date of Service: 10/21/2017 11:00 AM Medical Record Number: [id number] Patient Account Number: [id number] Date of Birth/Sex: 06-Feb-1932 (84 y.o. Female) Treating RN: Roger Shelter Primary Care Provider: Harrel Lemon Other Clinician: Referring Provider: Harrel Lemon Treating Provider/Extender: Melburn Hake, HOYT Weeks in Treatment: 2 History of Present Illness Location: right lower leg Quality: Patient reports experiencing a dull pain to affected area(s). Severity: Patient states wound are getting worse. Duration: Patient has had the wound for < 2 weeks prior to presenting for treatment Timing: Pain in wound is constant (hurts all the time) Context: The wound occurred when the patient had a fall and had significant skin tears and went to the ER Modifying Factors: Other treatment(s) tried include:the skin was placed back and Steri-Stripped Associated Signs and Symptoms: Patient reports having increase swelling. HPI Description: 84 year old patient has been seen recently by Dr. Hortencia Pilar of [facility] for leg pain and swelling associated with venous ulceration. She had been seen and treated with Unna boot therapy and the swelling got worse and there was discoloration of the leg. he recommended continuing compression stockings and elevation and exercise and he would not do any surgery at the present time. She should also continue with a lymphedema pump. he also noted evidence of arthrosclerosis of the lower extremity with claudication and noninvasive studies do not suggest clinically significant change. Noninvasive studies, angiogram or surgery at this time. He recommended continuing walking and more formal exercise program. The arterial studies done in September of this year showed a normal ABI bilaterally with the right being 1.03 and the left being 1.14 with triphasic flow bilaterally. She had a DVT study done in August 2018 but I do not see any note off of venous reflux study being done. past medical history significant for COPD, depression, hypertension, panic attacks, peripheral vascular disease, status post cholecystectomy, colonoscopy, hysterectomy and right fourth toe surgery.she quit smoking about 4 years ago and does not drink alcohol. Most recently she was seen on 10/02/2017 after a fall and a skin tear on her right leg with no evidence of fracture.the skin tear was approximated with Steri-Strips and a Xeroform gauze dressing and a tetanus toxoid is up-to-date. 10/21/17 on evaluation today patient is seen for follow-up concerning her right lower extremity skin tears. She overall does have a dull pain to the area which is a much more sharp/burning discomfort with cleansing. She has extremely fragile skin and is prone to injury of this nature she states. The good news is the more proximal ulceration seems to be doing much better at this point and in fact is healing quite nicely. The inferior ulceration did have a lot of loose skin which was cleansed  from around the wound. With that being said there is some Slough noted in the central aspect of  the wound as well although due to the pain which is more significant with cleansing I was unable to perform any debridement today. No fevers, chills, nausea, or vomiting noted at this time. Electronic Signature(s) Signed: 10/23/2017 11:24:19 AM By: Worthy Keeler PA-C Entered By: Worthy Keeler on 10/21/2017 12:42:01 Danielle Williamson, Danielle Williamson ([id number]) -------------------------------------------------------------------------------- Physical Exam Details Patient Name: Danielle Williamson Date of Service: 10/21/2017 11:00 AM Medical Record Number: [id number] Patient Account Number: [id number] Date of Birth/Sex: 01-Mar-1932 (84 y.o. Female) Treating RN: Roger Shelter Primary Care Provider: Harrel Lemon Other Clinician: Referring Provider: Harrel Lemon Treating Provider/Extender: STONE III, HOYT Weeks in Treatment: 2 Constitutional Thin and well-hydrated in no acute distress. Respiratory normal breathing without difficulty. clear to auscultation bilaterally. Cardiovascular regular rate and rhythm with normal S1, S2. 1+ pitting edema of the bilateral lower extremities. Psychiatric this patient is able to make decisions and demonstrates good insight into disease process. Alert and Oriented x 3. pleasant and cooperative. Notes Other than the slough noted in the central portion of patient's right inferior skin tear overall the wound appeared to be doing very well. I'm seeing definite progress in regard to the wound and specifically the measurements. There is no evidence of infection or purulent drainage at this point no sharp debridement performed due to patient's discomfort. Electronic Signature(s) Signed: 10/23/2017 11:24:19 AM By: Worthy Keeler PA-C Entered By: Worthy Keeler on 10/21/2017 12:43:38 Danielle Williamson, Danielle Williamson  ([id number]) -------------------------------------------------------------------------------- Physician Orders Details Patient Name: Danielle Williamson Date of Service: 10/21/2017 11:00 AM Medical Record Number: [id number] Patient Account Number: [id number] Date of Birth/Sex: Nov 19, 1931 (84 y.o. Female) Treating RN: Roger Shelter Primary Care Provider: Harrel Lemon Other Clinician: Referring Provider: Harrel Lemon Treating Provider/Extender: Melburn Hake, HOYT Weeks in Treatment: 2 Verbal / Phone Orders: No Diagnosis Coding ICD-10 Coding Code Description [phone] Laceration without foreign body, right lower leg, initial encounter L97.212 Non-pressure chronic ulcer of right calf with fat layer exposed I89.0 Lymphedema, not elsewhere classified Wound Cleansing Wound #1 Right,Proximal,Midline Lower Leg o Clean wound with Normal Saline. Wound #2 Right,Distal,Midline Lower Leg o Clean wound with Normal Saline. Anesthetic (add to Medication List) Wound #1 Right,Proximal,Midline Lower Leg o Topical Lidocaine 4% cream applied to wound bed prior to debridement (In Clinic Only). Wound #2 Right,Distal,Midline Lower Leg o Topical Lidocaine 4% cream applied to wound bed prior to debridement (In Clinic Only). Primary Wound Dressing Wound #1 Right,Proximal,Midline Lower Leg o Mepitel One Contact layer Wound #2 Right,Distal,Midline Lower Leg o Other: - silvercell Secondary Dressing Wound #1 Right,Proximal,Midline Lower Leg o ABD pad Wound #2 Right,Distal,Midline Lower Leg o ABD pad Dressing Change Frequency Wound #1 Right,Proximal,Midline Lower Leg o Change dressing every week Wound #2 Right,Distal,Midline Lower Leg o Change dressing every week Follow-up Appointments Danielle Williamson, Danielle H. ([id number]) o Return Appointment in 2 weeks. Edema Control o 3 Layer Compression System - Right Lower Extremity Home Health Wound #1 Right,Proximal,Midline Lower Leg o Initiate  Home Health for [place] Visits o Home Health Nurse may visit PRN to address patientos wound care needs. o FACE TO FACE ENCOUNTER: MEDICARE and MEDICAID PATIENTS: I certify that this patient is under my care and that I had a face-to-face encounter that meets the physician face-to-face encounter requirements with this patient on this date. The encounter with the patient was in whole or in part for the following MEDICAL CONDITION: (primary reason for [place]) MEDICAL NECESSITY: I certify, that based on my findings, NURSING services are a  medically necessary home health service. HOME BOUND STATUS: I certify that my clinical findings support that this patient is homebound (i.e., Due to illness or injury, pt requires aid of supportive devices such as crutches, cane, wheelchairs, walkers, the use of special transportation or the assistance of another person to leave their place of residence. There is a normal inability to leave the home and doing so requires considerable and taxing effort. Other absences are for medical reasons / religious services and are infrequent or of short duration when for other reasons). o If current dressing causes regression in wound condition, may D/C ordered dressing product/s and apply Normal Saline Moist Dressing daily until next [place] / Other MD appointment. [place] of regression in wound condition at [phone]. o Please direct any NON-WOUND related issues/requests for orders to patient's Primary Care Physician Wound #2 [place] for [place] Nurse may visit PRN to address patientos wound care needs. o FACE TO FACE ENCOUNTER: MEDICARE and MEDICAID PATIENTS: I certify that this patient is under my care and that I had a face-to-face encounter that meets the physician face-to-face  encounter requirements with this patient on this date. The encounter with the patient was in whole or in part for the following MEDICAL CONDITION: (primary reason for [place]) MEDICAL NECESSITY: I certify, that based on my findings, NURSING services are a medically necessary home health service. HOME BOUND STATUS: I certify that my clinical findings support that this patient is homebound (i.e., Due to illness or injury, pt requires aid of supportive devices such as crutches, cane, wheelchairs, walkers, the use of special transportation or the assistance of another person to leave their place of residence. There is a normal inability to leave the home and doing so requires considerable and taxing effort. Other absences are for medical reasons / religious services and are infrequent or of short duration when for other reasons). o If current dressing causes regression in wound condition, may D/C ordered dressing product/s and apply Normal Saline Moist Dressing daily until next [place] / Other MD appointment. [place] of regression in wound condition at [phone]. o Please direct any NON-WOUND related issues/requests for orders to patient's Primary Care Physician Electronic Signature(s) Signed: 10/21/2017 4:30:41 PM By: Roger Shelter Signed: 10/23/2017 11:24:19 AM By: Worthy Keeler PA-C Entered By: Roger Shelter on 10/21/2017 11:43:39 [place], Danielle Williamson ([id number]) -------------------------------------------------------------------------------- Problem List Details Patient Name: Danielle Williamson Date of Service: 10/21/2017 11:00 AM Medical Record Number: [id number] Patient Account Number: [id number] Date of Birth/Sex: 08/25/1932 (84 y.o. Female) Treating RN: Roger Shelter Primary Care Provider: Harrel Lemon Other Clinician: Referring Provider: Harrel Lemon Treating Provider/Extender: Melburn Hake, HOYT Weeks in Treatment: 2 Active  Problems ICD-10 Encounter Code Description Active Date Diagnosis S81.811A Laceration without foreign body, right lower leg, initial encounter 10/07/2017 Yes L97.212 Non-pressure chronic ulcer of right calf with fat layer exposed 10/07/2017 Yes I89.0 Lymphedema, not elsewhere classified 10/07/2017 Yes Inactive Problems Resolved Problems Electronic Signature(s) Signed: 10/23/2017 11:24:19 AM By: Worthy Keeler PA-C Entered By: Worthy Keeler on 10/21/2017 11:30:34 Schatzman, Danielle Williamson ([id number]) -------------------------------------------------------------------------------- Progress Note Details Patient Name: Danielle Williamson Date of Service: 10/21/2017 11:00 AM Medical Record Number: [id number] Patient Account Number: [id number] Date of Birth/Sex: 1932/01/28 (84 y.o. Female) Treating RN: Roger Shelter Primary Care Provider: Harrel Lemon Other Clinician: Referring Provider: Harrel Lemon Treating Provider/Extender: Melburn Hake, HOYT Weeks  in Treatment: 2 Subjective Chief Complaint Information obtained from Patient Patient seen for complaints of Non-Healing Wound to the right lower extremity which was caused by an accidental fall History of Present Illness (HPI) The following HPI elements were documented for the patient's wound: Location: right lower leg Quality: Patient reports experiencing a dull pain to affected area(s). Severity: Patient states wound are getting worse. Duration: Patient has had the wound for < 2 weeks prior to presenting for treatment Timing: Pain in wound is constant (hurts all the time) Context: The wound occurred when the patient had a fall and had significant skin tears and went to the ER Modifying Factors: Other treatment(s) tried include:the skin was placed back and Steri-Stripped Associated Signs and Symptoms: Patient reports having increase swelling. 84 year old patient has been seen recently by Dr. Hortencia Pilar of [facility] for leg  pain and swelling associated with venous ulceration. She had been seen and treated with Unna boot therapy and the swelling got worse and there was discoloration of the leg. he recommended continuing compression stockings and elevation and exercise and he would not do any surgery at the present time. She should also continue with a lymphedema pump. he also noted evidence of arthrosclerosis of the lower extremity with claudication and noninvasive studies do not suggest clinically significant change. Noninvasive studies, angiogram or surgery at this time. He recommended continuing walking and more formal exercise program. The arterial studies done in September of this year showed a normal ABI bilaterally with the right being 1.03 and the left being 1.14 with triphasic flow bilaterally. She had a DVT study done in August 2018 but I do not see any note off of venous reflux study being done. past medical history significant for COPD, depression, hypertension, panic attacks, peripheral vascular disease, status post cholecystectomy, colonoscopy, hysterectomy and right fourth toe surgery.she quit smoking about 4 years ago and does not drink alcohol. Most recently she was seen on 10/02/2017 after a fall and a skin tear on her right leg with no evidence of fracture.the skin tear was approximated with Steri-Strips and a Xeroform gauze dressing and a tetanus toxoid is up-to-date. 10/21/17 on evaluation today patient is seen for follow-up concerning her right lower extremity skin tears. She overall does have a dull pain to the area which is a much more sharp/burning discomfort with cleansing. She has extremely fragile skin and is prone to injury of this nature she states. The good news is the more proximal ulceration seems to be doing much better at this point and in fact is healing quite nicely. The inferior ulceration did have a lot of loose skin which was cleansed from around the wound. With that being said  there is some Slough noted in the central aspect of the wound as well although due to the pain which is more significant with cleansing I was unable to perform any debridement today. No fevers, chills, nausea, or vomiting noted at this time. Patient History Information obtained from Patient. Family History Cancer - Father, Heart Disease - Mother, Hypertension - Mother, Kidney Disease - Father, Stroke - Paternal Grandparents, Thyroid Problems - Father, Danielle Williamson, Danielle Williamson. ([id number]) No family history of Diabetes, Lung Disease, Seizures, Tuberculosis. Social History Former smoker - 4 years ago, Marital Status - Widowed, Alcohol Use - Rarely, Drug Use - No History, Caffeine Use - Daily. Review of Systems (ROS) Constitutional Symptoms (General Health) Denies complaints or symptoms of Fever, Chills. Respiratory The patient has no complaints or symptoms. Cardiovascular  Complains or has symptoms of LE edema. Psychiatric The patient has no complaints or symptoms. Objective Constitutional Thin and well-hydrated in no acute distress. Vitals Time Taken: 11:09 AM, Height: 66 in, Source: Stated, Temperature: 98.0 F, Pulse: 72 bpm, Respiratory Rate: 18 breaths/min, Blood Pressure: 128/53 mmHg. Respiratory normal breathing without difficulty. clear to auscultation bilaterally. Cardiovascular regular rate and rhythm with normal S1, S2. 1+ pitting edema of the bilateral lower extremities. Psychiatric this patient is able to make decisions and demonstrates good insight into disease process. Alert and Oriented x 3. pleasant and cooperative. General Notes: Other than the slough noted in the central portion of patient's right inferior skin tear overall the wound appeared to be doing very well. I'm seeing definite progress in regard to the wound and specifically the measurements. There is no evidence of infection or purulent drainage at this point no sharp debridement performed due to patient's  discomfort. Integumentary (Hair, Skin) Wound #1 status is Open. Original cause of wound was Trauma. The wound is located on the Right,Proximal,Midline Lower Leg. The wound measures 6.2cm length x 3.5cm width x 0.1cm depth; 17.043cm^2 area and 1.704cm^3 volume. There is Fat Layer (Subcutaneous Tissue) Exposed exposed. There is no tunneling or undermining noted. There is a medium amount of serosanguineous drainage noted. The wound margin is flat and intact. There is medium (34-66%) red, pink granulation within the wound bed. There is a medium (34-66%) amount of necrotic tissue within the wound bed including Eschar and Adherent Slough. The periwound skin appearance exhibited: Excoriation, Induration, Scarring, Maceration. The periwound skin appearance did not exhibit: Callus, Crepitus, Rash, Dry/Scaly, Atrophie Blanche, Cyanosis, Ecchymosis, Hemosiderin Staining, Mottled, Pallor, Rubor, Erythema. Wound #2 status is Open. Original cause of wound was Trauma. The wound is located on the Right,Distal,Midline Lower Leg. The wound measures 6.2cm length x 2.2cm width x 0.1cm depth; 10.713cm^2 area and 1.071cm^3 volume. There is Fat Pauley, Soliyana H. ([id number]) Layer (Subcutaneous Tissue) Exposed exposed. There is no tunneling or undermining noted. There is a large amount of serosanguineous drainage noted. The wound margin is flat and intact. There is medium (34-66%) red, pink granulation within the wound bed. There is a medium (34-66%) amount of necrotic tissue within the wound bed including Adherent Slough. The periwound skin appearance exhibited: Excoriation, Induration, Scarring, Maceration. The periwound skin appearance did not exhibit: Callus, Crepitus, Rash, Dry/Scaly, Atrophie Blanche, Cyanosis, Ecchymosis, Hemosiderin Staining, Mottled, Pallor, Rubor, Erythema. Assessment Active Problems ICD-10 S81.811A - Laceration without foreign body, right lower leg, initial encounter L97.212 - Non-pressure  chronic ulcer of right calf with fat layer exposed I89.0 - Lymphedema, not elsewhere classified Plan Wound Cleansing: Wound #1 Right,Proximal,Midline Lower Leg: Clean wound with Normal Saline. Wound #2 Right,Distal,Midline Lower Leg: Clean wound with Normal Saline. Anesthetic (add to Medication List): Wound #1 Right,Proximal,Midline Lower Leg: Topical Lidocaine 4% cream applied to wound bed prior to debridement (In Clinic Only). Wound #2 Right,Distal,Midline Lower Leg: Topical Lidocaine 4% cream applied to wound bed prior to debridement (In Clinic Only). Primary Wound Dressing: Wound #1 Right,Proximal,Midline Lower Leg: Mepitel One Contact layer Wound #2 Right,Distal,Midline Lower Leg: Other: - silvercell Secondary Dressing: Wound #1 Right,Proximal,Midline Lower Leg: ABD pad Wound #2 Right,Distal,Midline Lower Leg: ABD pad Dressing Change Frequency: Wound #1 Right,Proximal,Midline Lower Leg: Change dressing every week Wound #2 Right,Distal,Midline Lower Leg: Change dressing every week Follow-up Appointments: Return Appointment in 2 weeks. Edema Control: 3 Layer Compression System - Right Lower Extremity Home Health: Wound #1 Right,Proximal,Midline Lower Leg: [facility] Danielle Williamson, Danielle Williamson ([id number]) [place] Visits Home Health Nurse may visit PRN to address patient s wound care needs. FACE TO FACE ENCOUNTER: MEDICARE and MEDICAID PATIENTS: I certify that this patient is under my care and that I had a face-to-face encounter that meets the physician face-to-face encounter requirements with this patient on this date. The encounter with the patient was in whole or in part for the following MEDICAL CONDITION: (primary reason for [place]) MEDICAL NECESSITY: I certify, that based on my findings, NURSING services are a medically necessary home health service. HOME BOUND STATUS: I certify that my clinical findings support that this  patient is homebound (i.e., Due to illness or injury, pt requires aid of supportive devices such as crutches, cane, wheelchairs, walkers, the use of special transportation or the assistance of another person to leave their place of residence. There is a normal inability to leave the home and doing so requires considerable and taxing effort. Other absences are for medical reasons / religious services and are infrequent or of short duration when for other reasons). If current dressing causes regression in wound condition, may D/C ordered dressing product/s and apply Normal Saline Moist Dressing daily until next [place] / Other MD appointment. [place] of regression in wound condition at [phone]. Please direct any NON-WOUND related issues/requests for orders to patient's Primary Care Physician Wound #2 Right,Distal,Midline Lower Leg: [place] for [place] Nurse may visit PRN to address patient s wound care needs. FACE TO FACE ENCOUNTER: MEDICARE and MEDICAID PATIENTS: I certify that this patient is under my care and that I had a face-to-face encounter that meets the physician face-to-face encounter requirements with this patient on this date. The encounter with the patient was in whole or in part for the following MEDICAL CONDITION: (primary reason for [place]) MEDICAL NECESSITY: I certify, that based on my findings, NURSING services are a medically necessary home health service. HOME BOUND STATUS: I certify that my clinical findings support that this patient is homebound (i.e., Due to illness or injury, pt requires aid of supportive devices such as crutches, cane, wheelchairs, walkers, the use of special transportation or the assistance of another person to leave their place of residence. There is a normal inability to leave the home and doing so requires considerable and taxing effort. Other  absences are for medical reasons / religious services and are infrequent or of short duration when for other reasons). If current dressing causes regression in wound condition, may D/C ordered dressing product/s and apply Normal Saline Moist Dressing daily until next [place] / Other MD appointment. [place] of regression in wound condition at [phone]. Please direct any NON-WOUND related issues/requests for orders to patient's Primary Care Physician I am going to switch from enough detail to a silver alginate dressing for the inferior skin tear. We will continue with the compression wraps as well we will see how things do over the next week. Please see above for specific wound care orders. We will see patient for re-evaluation in 1 week(s) here in the clinic. If anything worsens or changes patient will contact our office for additional recommendations. Electronic Signature(s) Signed: 10/23/2017 11:24:19 AM By: Worthy Keeler PA-C Entered By: Worthy Keeler on 10/21/2017 12:44:22 Danielle Williamson, Danielle Williamson ([id number]) -------------------------------------------------------------------------------- ROS/PFSH Details Patient Name: Danielle Williamson Date of Service: 10/21/2017 11:00 AM Medical Record Number: [id number] Patient Account Number: [id number]  Date of Birth/Sex: [birth year] (84 y.o. Female) Treating RN: Roger Shelter Primary Care Provider: Harrel Lemon Other Clinician: Referring Provider: Harrel Lemon Treating Provider/Extender: Melburn Hake, HOYT Weeks in Treatment: 2 Information Obtained From Patient Wound History Do you currently have one or more open woundso Yes How many open wounds do you currently haveo 3 Approximately how long have you had your woundso 6 days Has your wound(s) ever healed and then re-openedo No Have you had any lab work done in the past montho No Have you tested positive for an antibiotic resistant organism (MRSA, VRE)o No Have  you tested positive for osteomyelitis (bone infection)o No Have you had any tests for circulation on your legso Yes Who ordered the testo Dr. Arelia Sneddon Where was the test doneo few months ago Have you had other problems associated with your woundso Swelling Constitutional Symptoms (General Health) Complaints and Symptoms: Negative for: Fever; Chills Cardiovascular Complaints and Symptoms: Positive for: LE edema Medical History: Positive for: Arrhythmia Negative for: Angina; Congestive Heart Failure; Coronary Artery Disease; Deep Vein Thrombosis; Hypertension; Hypotension; Myocardial Infarction; Peripheral Arterial Disease; Peripheral Venous Disease; Phlebitis; Vasculitis Eyes Medical History: Positive for: Cataracts - surgery in past Negative for: Glaucoma; Optic Neuritis Ear/Nose/Mouth/Throat Medical History: Negative for: Chronic sinus problems/congestion; Middle ear problems Hematologic/Lymphatic Medical History: Positive for: Anemia; Lymphedema Negative for: Hemophilia; Human Immunodeficiency Virus; Sickle Cell Disease Respiratory Danielle Williamson, Danielle H. ([id number]) Complaints and Symptoms: No Complaints or Symptoms Medical History: Positive for: Asthma; Chronic Obstructive Pulmonary Disease (COPD) Negative for: Aspiration; Pneumothorax; Sleep Apnea; Tuberculosis Gastrointestinal Medical History: Negative for: Cirrhosis ; Colitis; Crohnos; Hepatitis A; Hepatitis B; Hepatitis C Endocrine Medical History: Negative for: Type I Diabetes; Type II Diabetes Genitourinary Medical History: Negative for: End Stage Renal Disease Immunological Medical History: Negative for: Lupus Erythematosus; Raynaudos; Scleroderma Integumentary (Skin) Medical History: Negative for: History of Burn; History of pressure wounds Musculoskeletal Medical History: Positive for: Rheumatoid Arthritis Negative for: Gout; Osteoarthritis; Osteomyelitis Neurologic Medical History: Negative for: Dementia;  Neuropathy; Quadriplegia; Paraplegia; Seizure Disorder Oncologic Medical History: Negative for: Received Chemotherapy; Received Radiation Psychiatric Complaints and Symptoms: No Complaints or Symptoms Medical History: Negative for: Anorexia/bulimia; Confinement Anxiety HBO Extended History Items Eyes: Cataracts Danielle Williamson, Danielle H. ([id number]) Immunizations Pneumococcal Vaccine: Received Pneumococcal Vaccination: Yes Implantable Devices Family and Social History Cancer: Yes - Father; Diabetes: No; Heart Disease: Yes - Mother; Hypertension: Yes - Mother; Kidney Disease: Yes - Father; Lung Disease: No; Seizures: No; Stroke: Yes - Paternal Grandparents; Thyroid Problems: Yes - Father; Tuberculosis: No; Former smoker - 4 years ago; Marital Status - Widowed; Alcohol Use: Rarely; Drug Use: No History; Caffeine Use: Daily; Financial Concerns: No; Food, Clothing or Shelter Needs: No; Support System Lacking: No; Transportation Concerns: No; Advanced Directives: Yes (Not Provided); Patient does not want information on Advanced Directives; Do not resuscitate: Yes (Not Provided); Living Will: Yes (Not Provided); Medical Power of Attorney: Yes (Not Provided) Physician Affirmation I have reviewed and agree with the above information. Electronic Signature(s) Signed: 10/21/2017 4:30:41 PM By: Roger Shelter Signed: 10/23/2017 11:24:19 AM By: Worthy Keeler PA-C Entered By: Worthy Keeler on 10/21/2017 12:42:49 Danielle Williamson, Danielle Williamson ([id number]) -------------------------------------------------------------------------------- SuperBill Details Patient Name: Danielle Williamson Date of Service: 10/21/2017 Medical Record Number: [id number] Patient Account Number: [id number] Date of Birth/Sex: May 30, 1932 (84 y.o. Female) Treating RN: Roger Shelter Primary Care Provider: Harrel Lemon Other Clinician: Referring Provider: Harrel Lemon Treating Provider/Extender: Melburn Hake, HOYT Weeks in Treatment:  2 Diagnosis Coding ICD-10 Codes Code Description [id number] Laceration without foreign body, right  lower leg, initial encounter L97.212 Non-pressure chronic ulcer of right calf with fat layer exposed I89.0 Lymphedema, not elsewhere classified Facility Procedures CPT4 Code: 15947076 Description: 15183 - WOUND CARE VISIT-LEV 4 EST PT Modifier: Quantity: 1 Physician Procedures CPT4 Code: 4373578 Description: 97847 - WC PHYS LEVEL 3 - EST PT ICD-10 Diagnosis Description S81.811A Laceration without foreign body, right lower leg, initial en L97.212 Non-pressure chronic ulcer of right calf with fat layer expo I89.0 Lymphedema, not elsewhere  classified Modifier: counter sed Quantity: 1 Electronic Signature(s) Signed: 10/23/2017 11:24:19 AM By: Worthy Keeler PA-C Entered By: Worthy Keeler on 10/21/2017 12:44:41

## 2017-10-23 NOTE — Progress Notes (Signed)
Danielle, Williamson ([id number]) Visit Report for 10/21/2017 Arrival Information Details Patient Name: Danielle Williamson, Danielle Williamson Date of Service: 10/21/2017 11:00 AM Medical Record Number: [id number] Patient Account Number: [id number] Date of Birth/Sex: 1932/03/23 (85 y.o. Female) Treating RN: Roger Shelter Primary Care Zeyna Mkrtchyan: Harrel Lemon Other Clinician: Referring Dixie Jafri: Harrel Lemon Treating Catricia Scheerer/Extender: Melburn Hake, HOYT Weeks in Treatment: 2 Visit Information History Since Last Visit All ordered tests and consults were completed: No Patient Arrived: Ambulatory Added or deleted any medications: No Arrival Time: 11:06 Any new allergies or adverse reactions: No Accompanied By: Daughter Had a fall or experienced change in No Transfer Assistance: None activities of daily living that may affect Patient Identification Verified: Yes risk of falls: Secondary Verification Process Completed: Yes Signs or symptoms of abuse/neglect since last visito No Patient Requires Transmission-Based No Hospitalized since last visit: No Precautions: Pain Present Now: Yes Patient Has Alerts: Yes Patient Alerts: right ABI- 1.21 Left ABI- 1.14 Electronic Signature(s) Signed: 10/21/2017 4:30:41 PM By: Roger Shelter Entered By: Roger Shelter on 10/21/2017 11:07:39 Danielle Williamson ([id number]) -------------------------------------------------------------------------------- Clinic Level of Care Assessment Details Patient Name: Danielle, Williamson Date of Service: 10/21/2017 11:00 AM Medical Record Number: [id number] Patient Account Number: [id number] Date of Birth/Sex: 1932-09-08 (85 y.o. Female) Treating RN: Roger Shelter Primary Care Abigaelle Verley: Harrel Lemon Other Clinician: Referring Kaydn Kumpf: Harrel Lemon Treating Makaylyn Sinyard/Extender: Melburn Hake, HOYT Weeks in Treatment: 2 Clinic Level of Care Assessment Items TOOL 4 Quantity Score []  - Use when only an EandM is performed on  FOLLOW-UP visit 0 ASSESSMENTS - Nursing Assessment / Reassessment X - Reassessment of Co-morbidities (includes updates in patient status) 1 10 X- 1 5 Reassessment of Adherence to Treatment Plan ASSESSMENTS - Wound and Skin Assessment / Reassessment []  - Simple Wound Assessment / Reassessment - one wound 0 X- 2 5 Complex Wound Assessment / Reassessment - multiple wounds []  - 0 Dermatologic / Skin Assessment (not related to wound area) ASSESSMENTS - Focused Assessment []  - Circumferential Edema Measurements - multi extremities 0 []  - 0 Nutritional Assessment / Counseling / Intervention []  - 0 Lower Extremity Assessment (monofilament, tuning fork, pulses) []  - 0 Peripheral Arterial Disease Assessment (using hand held doppler) ASSESSMENTS - Ostomy and/or Continence Assessment and Care []  - Incontinence Assessment and Management 0 []  - 0 Ostomy Care Assessment and Management (repouching, etc.) PROCESS - Coordination of Care []  - Simple Patient / Family Education for ongoing care 0 X- 1 20 Complex (extensive) Patient / Family Education for ongoing care []  - 0 Staff obtains Programmer, systems, Records, Test Results / Process Orders []  - 0 Staff telephones HHA, Nursing Homes / Clarify orders / etc []  - 0 Routine Transfer to another Facility (non-emergent condition) []  - 0 Routine Hospital Admission (non-emergent condition) []  - 0 New Admissions / Biomedical engineer / Ordering NPWT, Apligraf, etc. []  - 0 Emergency Hospital Admission (emergent condition) []  - 0 Simple Discharge Coordination [name], [name] ([id number]) X- 1 15 Complex (extensive) Discharge Coordination PROCESS - Special Needs []  - Pediatric / Minor Patient Management 0 []  - 0 Isolation Patient Management []  - 0 Hearing / Language / Visual special needs []  - 0 Assessment of Community assistance (transportation, D/C planning, etc.) []  - 0 Additional assistance / Altered mentation []  - 0 Support Surface(s)  Assessment (bed, cushion, seat, etc.) INTERVENTIONS - Wound Cleansing / Measurement []  - Simple Wound Cleansing - one wound 0 X- 2 5 Complex Wound Cleansing - multiple wounds X- 1 5 Wound Imaging (photographs - any number  of wounds) []  - 0 Wound Tracing (instead of photographs) []  - 0 Simple Wound Measurement - one wound X- 2 5 Complex Wound Measurement - multiple wounds INTERVENTIONS - Wound Dressings []  - Small Wound Dressing one or multiple wounds 0 X- 2 15 Medium Wound Dressing one or multiple wounds []  - 0 Large Wound Dressing one or multiple wounds []  - 0 Application of Medications - topical []  - 0 Application of Medications - injection INTERVENTIONS - Miscellaneous []  - External ear exam 0 []  - 0 Specimen Collection (cultures, biopsies, blood, body fluids, etc.) []  - 0 Specimen(s) / Culture(s) sent or taken to Lab for analysis []  - 0 Patient Transfer (multiple staff / Civil Service fast streamer / Similar devices) []  - 0 Simple Staple / Suture removal (25 or less) []  - 0 Complex Staple / Suture removal (26 or more) []  - 0 Hypo / Hyperglycemic Management (close monitor of Blood Glucose) []  - 0 Ankle / Brachial Index (ABI) - do not check if billed separately X- 1 5 Vital Signs [name], [name] ([id number]) Has the patient been seen at the hospital within the last three years: Yes Total Score: 120 Level Of Care: New/Established - Level 4 Electronic Signature(s) Signed: 10/21/2017 4:30:41 PM By: Roger Shelter Entered By: Roger Shelter on 10/21/2017 11:44:24 Danielle Williamson ([id number]) -------------------------------------------------------------------------------- Encounter Discharge Information Details Patient Name: Danielle Williamson Date of Service: 10/21/2017 11:00 AM Medical Record Number: [id number] Patient Account Number: [id number] Date of Birth/Sex: August 22, 1932 (85 y.o. Female) Treating RN: Roger Shelter Primary Care Deniel Mcquiston: Harrel Lemon Other  Clinician: Referring Hayleen Clinkscales: Harrel Lemon Treating Kazmir Oki/Extender: Melburn Hake, HOYT Weeks in Treatment: 2 Encounter Discharge Information Items Discharge Pain Level: 2 Discharge Condition: Stable Ambulatory Status: Ambulatory Discharge Destination: Home Transportation: Private Auto Accompanied By: daughter Schedule Follow-up Appointment: Yes Medication Reconciliation completed and No provided to Patient/Care Charleene Callegari: Provided on Clinical Summary of Care: 10/21/2017 Form Type Recipient Paper Patient [place] Electronic Signature(s) Signed: 10/23/2017 8:37:20 AM By: Ruthine Dose Entered By: Ruthine Dose on 10/21/2017 12:01:00 Danielle Williamson ([id number]) -------------------------------------------------------------------------------- Lower Extremity Assessment Details Patient Name: Danielle Williamson Date of Service: 10/21/2017 11:00 AM Medical Record Number: [id number] Patient Account Number: [id number] Date of Birth/Sex: [birth year] (85 y.o. Female) Treating RN: Roger Shelter Primary Care Terran Hollenkamp: Harrel Lemon Other Clinician: Referring Eliu Batch: Harrel Lemon Treating Ayaansh Smail/Extender: Melburn Hake, HOYT Weeks in Treatment: 2 Edema Assessment Assessed: [Left: No] [Right: No] Edema: [Left: Ye] [Right: s] Vascular Assessment Claudication: Claudication Assessment [Right:None] Pulses: Dorsalis Pedis Palpable: [Right:Yes] Posterior Tibial Extremity colors, hair growth, and conditions: Extremity Color: [Right:Hyperpigmented] Hair Growth on Extremity: [Right:Yes] Temperature of Extremity: [Right:Cool] Capillary Refill: [Right:> 3 seconds] Toe Nail Assessment Left: Right: Thick: Yes Discolored: Yes Deformed: No Improper Length and Hygiene: No Electronic Signature(s) Signed: 10/21/2017 4:30:41 PM By: Roger Shelter Entered By: Roger Shelter on 10/21/2017 11:25:26 Burnside, Vickii Williamson  ([id number]) -------------------------------------------------------------------------------- Multi Wound Chart Details Patient Name: Danielle Williamson Date of Service: 10/21/2017 11:00 AM Medical Record Number: [id number] Patient Account Number: [id number] Date of Birth/Sex: 1931-10-28 (85 y.o. Female) Treating RN: Roger Shelter Primary Care Inocente Krach: Harrel Lemon Other Clinician: Referring Milliana Reddoch: Harrel Lemon Treating Cass Edinger/Extender: Melburn Hake, HOYT Weeks in Treatment: 2 Vital Signs Height(in): 68 Pulse(bpm): 25 Weight(lbs): Blood Pressure(mmHg): 128/53 Body Mass Index(BMI): Temperature(F): 98.0 Respiratory Rate 18 (breaths/min): Photos: [1:No Photos] [2:No Photos] [N/A:N/A] Wound Location: [1:Right Lower Leg - Midline, Proximal] [2:Right Lower Leg - Midline, Distal] [N/A:N/A] Wounding Event: [1:Trauma] [2:Trauma] [N/A:N/A] Primary Etiology: [1:Trauma, Other] [2:Trauma, Other] [N/A:N/A] Comorbid History: [  1:Cataracts, Anemia, Lymphedema, Asthma, Chronic Obstructive Pulmonary Disease (COPD), Arrhythmia, Rheumatoid Arthritis] [2:Cataracts, Anemia, Lymphedema, Asthma, Chronic Obstructive Pulmonary Disease (COPD), Arrhythmia, Rheumatoid  Arthritis] [N/A:N/A] Date Acquired: [1:10/02/2017] [2:10/02/2017] [N/A:N/A] Weeks of Treatment: [1:2] [2:2] [N/A:N/A] Wound Status: [1:Open] [2:Open] [N/A:N/A] Measurements L x W x D [1:6.2x3.5x0.1] [2:6.2x2.2x0.1] [N/A:N/A] (cm) Area (cm) : [1:17.043] [2:10.713] [N/A:N/A] Volume (cm) : [1:1.704] [2:1.071] [N/A:N/A] % Reduction in Area: [1:-84.50%] [2:-177.30%] [N/A:N/A] % Reduction in Volume: [1:-84.40%] [2:-177.50%] [N/A:N/A] Classification: [1:Partial Thickness] [2:Partial Thickness] [N/A:N/A] Exudate Amount: [1:Medium] [2:Large] [N/A:N/A] Exudate Type: [1:Serosanguineous] [2:Serosanguineous] [N/A:N/A] Exudate Color: [1:red, brown] [2:red, brown] [N/A:N/A] Wound Margin: [1:Flat and Intact] [2:Flat and Intact]  [N/A:N/A] Granulation Amount: [1:Medium (34-66%)] [2:Medium (34-66%)] [N/A:N/A] Granulation Quality: [1:Red, Pink] [2:Red, Pink] [N/A:N/A] Necrotic Amount: [1:Medium (34-66%)] [2:Medium (34-66%)] [N/A:N/A] Necrotic Tissue: [1:Eschar, Adherent Slough] [2:Adherent Slough] [N/A:N/A] Exposed Structures: [1:Fat Layer (Subcutaneous Tissue) Exposed: Yes] [2:Fat Layer (Subcutaneous Tissue) Exposed: Yes] [N/A:N/A] Epithelialization: [1:None] [2:None] [N/A:N/A] Periwound Skin Texture: [1:Excoriation: Yes Induration: Yes Scarring: Yes Callus: No] [2:Excoriation: Yes Induration: Yes Scarring: Yes Callus: No] [N/A:N/A] Crepitus: No Crepitus: No Rash: No Rash: No Periwound Skin Moisture: Maceration: Yes Maceration: Yes N/A Dry/Scaly: No Dry/Scaly: No Periwound Skin Color: Atrophie Blanche: No Atrophie Blanche: No N/A Cyanosis: No Cyanosis: No Ecchymosis: No Ecchymosis: No Erythema: No Erythema: No Hemosiderin Staining: No Hemosiderin Staining: No Mottled: No Mottled: No Pallor: No Pallor: No Rubor: No Rubor: No Tenderness on Palpation: No No N/A Wound Preparation: Ulcer Cleansing: Ulcer Cleansing: N/A Rinsed/Irrigated with Saline Rinsed/Irrigated with Saline Topical Anesthetic Applied: Topical Anesthetic Applied: Other: lidocaine 4% Other: lidocaine 4% Treatment Notes Electronic Signature(s) Signed: 10/21/2017 4:30:41 PM By: Roger Shelter Entered By: Roger Shelter on 10/21/2017 11:25:41 Rishel, Vickii Williamson ([id number]) -------------------------------------------------------------------------------- [facility] Details Patient Name: Danielle Williamson Date of Service: 10/21/2017 11:00 AM Medical Record Number: [id number] Patient Account Number: [id number] Date of Birth/Sex: 1932-05-16 (85 y.o. Female) Treating RN: Roger Shelter Primary Care Tobe Kervin: Harrel Lemon Other Clinician: Referring Lysandra Loughmiller: Harrel Lemon Treating Sherion Dooly/Extender: Melburn Hake,  HOYT Weeks in Treatment: 2 Active Inactive ` Orientation to the Wound Care Program Nursing Diagnoses: Knowledge deficit related to the wound healing center program Goals: Patient/caregiver will verbalize understanding of the [facility] Program Date Initiated: 10/07/2017 Target Resolution Date: 11/07/2017 Goal Status: Active Interventions: Provide education on orientation to the wound center Notes: ` Wound/Skin Impairment Nursing Diagnoses: Impaired tissue integrity Knowledge deficit related to ulceration/compromised skin integrity Goals: Patient/caregiver will verbalize understanding of skin care regimen Date Initiated: 10/07/2017 Target Resolution Date: 11/07/2017 Goal Status: Active Ulcer/skin breakdown will have a volume reduction of 30% by week 4 Date Initiated: 10/07/2017 Target Resolution Date: 11/07/2017 Goal Status: Active Interventions: Assess patient/caregiver ability to obtain necessary supplies Assess ulceration(s) every visit Provide education on ulcer and skin care Treatment Activities: Skin care regimen initiated : 10/07/2017 Notes: Electronic Signature(s) Signed: 10/21/2017 4:30:41 PM By: Lisette Abu ([id number]) Entered By: Roger Shelter on 10/21/2017 11:25:31 Munn, Vickii Williamson ([id number]) -------------------------------------------------------------------------------- Pain Assessment Details Patient Name: Danielle Williamson Date of Service: 10/21/2017 11:00 AM Medical Record Number: [id number] Patient Account Number: [id number] Date of Birth/Sex: [birth year] (85 y.o. Female) Treating RN: Roger Shelter Primary Care Zarin Knupp: Harrel Lemon Other Clinician: Referring Jadalyn Oliveri: Harrel Lemon Treating Jaszmine Navejas/Extender: Melburn Hake, HOYT Weeks in Treatment: 2 Active Problems Location of Pain Severity and Description of Pain Patient Has Paino Yes Site Locations Duration of the Pain. Constant / Intermittento  Intermittent Rate the pain. Current Pain Level: 4 Pain Management and Medication Current Pain Management:  Electronic Signature(s) Signed: 10/21/2017 4:30:41 PM By: Roger Shelter Entered By: Roger Shelter on 10/21/2017 11:07:52 JEANINNE, LODICO ([id number]) -------------------------------------------------------------------------------- Patient/Caregiver Education Details Patient Name: Danielle Williamson Date of Service: 10/21/2017 11:00 AM Medical Record Number: [id number] Patient Account Number: [id number] Date of Birth/Gender: Jul 05, 1932 (85 y.o. Female) Treating RN: Roger Shelter Primary Care Physician: Harrel Lemon Other Clinician: Referring Physician: Harrel Lemon Treating Physician/Extender: Sharalyn Ink in Treatment: 2 Education Assessment Education Provided To: Patient Education Topics Provided Wound/Skin Impairment: Handouts: Caring for Your Ulcer Methods: Explain/Verbal Responses: State content correctly Electronic Signature(s) Signed: 10/21/2017 4:30:41 PM By: Roger Shelter Entered By: Roger Shelter on 10/21/2017 11:46:28 Hulick, Vickii Williamson ([id number]) -------------------------------------------------------------------------------- Wound Assessment Details Patient Name: Danielle Williamson Date of Service: 10/21/2017 11:00 AM Medical Record Number: [id number] Patient Account Number: [id number] Date of Birth/Sex: 1932-04-14 (85 y.o. Female) Treating RN: Roger Shelter Primary Care Anzleigh Slaven: Harrel Lemon Other Clinician: Referring Nekia Maxham: Harrel Lemon Treating Adekunle Rohrbach/Extender: Melburn Hake, HOYT Weeks in Treatment: 2 Wound Status Wound Number: 1 Primary Trauma, Other Etiology: Wound Location: Right Lower Leg - Midline, Proximal Wound Open Wounding Event: Trauma Status: Date Acquired: 10/02/2017 Comorbid Cataracts, Anemia, Lymphedema, Asthma, Weeks Of Treatment: 2 History: Chronic Obstructive Pulmonary Disease Clustered Wound:  No (COPD), Arrhythmia, Rheumatoid Arthritis Photos Photo Uploaded By: Roger Shelter on 10/21/2017 12:41:18 Wound Measurements Length: (cm) 6.2 Width: (cm) 3.5 Depth: (cm) 0.1 Area: (cm) 17.043 Volume: (cm) 1.704 % Reduction in Area: -84.5% % Reduction in Volume: -84.4% Epithelialization: None Tunneling: No Undermining: No Wound Description Classification: Partial Thickness Wound Margin: Flat and Intact Exudate Amount: Medium Exudate Type: Serosanguineous Exudate Color: red, brown Foul Odor After Cleansing: No Slough/Fibrino Yes Wound Bed Granulation Amount: Medium (34-66%) Exposed Structure Granulation Quality: Red, Pink Fat Layer (Subcutaneous Tissue) Exposed: Yes Necrotic Amount: Medium (34-66%) Necrotic Quality: Eschar, Adherent Slough Periwound Skin Texture Texture Color No Abnormalities Noted: No No Abnormalities Noted: No Callus: No Atrophie Blanche: No Zapf, Vernie H. ([id number]) Crepitus: No Cyanosis: No Excoriation: Yes Ecchymosis: No Induration: Yes Erythema: No Rash: No Hemosiderin Staining: No Scarring: Yes Mottled: No Pallor: No Moisture Rubor: No No Abnormalities Noted: No Dry / Scaly: No Maceration: Yes Wound Preparation Ulcer Cleansing: Rinsed/Irrigated with Saline Topical Anesthetic Applied: Other: lidocaine 4%, Treatment Notes Wound #1 (Right, Proximal, Midline Lower Leg) 1. Cleansed with: Clean wound with Normal Saline 2. Anesthetic Topical Lidocaine 4% cream to wound bed prior to debridement 4. Dressing Applied: Other dressing (specify in notes) 5. Secondary Dressing Applied ABD Pad 7. Secured with 3 Layer Compression System - Right Lower Extremity Notes mepitel pad Electronic Signature(s) Signed: 10/21/2017 4:30:41 PM By: Roger Shelter Entered By: Roger Shelter on 10/21/2017 11:23:57 Hyle, Peytyn Lemmie Evens ([id number]) -------------------------------------------------------------------------------- Wound Assessment  Details Patient Name: Danielle Williamson Date of Service: 10/21/2017 11:00 AM Medical Record Number: [id number] Patient Account Number: [id number] Date of Birth/Sex: [birth year] (85 y.o. Female) Treating RN: Roger Shelter Primary Care Balbina Depace: Harrel Lemon Other Clinician: Referring Jathan Balling: Harrel Lemon Treating Kyle Stansell/Extender: Melburn Hake, HOYT Weeks in Treatment: 2 Wound Status Wound Number: 2 Primary Trauma, Other Etiology: Wound Location: Right Lower Leg - Midline, Distal Wound Open Wounding Event: Trauma Status: Date Acquired: 10/02/2017 Comorbid Cataracts, Anemia, Lymphedema, Asthma, Weeks Of Treatment: 2 History: Chronic Obstructive Pulmonary Disease Clustered Wound: No (COPD), Arrhythmia, Rheumatoid Arthritis Photos Photo Uploaded By: Roger Shelter on 10/21/2017 12:41:18 Wound Measurements Length: (cm) 6.2 Width: (cm) 2.2 Depth: (cm) 0.1 Area: (cm) 10.713 Volume: (cm) 1.071 % Reduction in Area: -177.3% % Reduction in Volume: -177.5%  Epithelialization: None Tunneling: No Undermining: No Wound Description Classification: Partial Thickness Wound Margin: Flat and Intact Exudate Amount: Large Exudate Type: Serosanguineous Exudate Color: red, brown Foul Odor After Cleansing: No Slough/Fibrino Yes Wound Bed Granulation Amount: Medium (34-66%) Exposed Structure Granulation Quality: Red, Pink Fat Layer (Subcutaneous Tissue) Exposed: Yes Necrotic Amount: Medium (34-66%) Necrotic Quality: Adherent Slough Periwound Skin Texture Texture Color No Abnormalities Noted: No No Abnormalities Noted: No Callus: No Atrophie Blanche: No Sausedo, Usha H. ([id number]) Crepitus: No Cyanosis: No Excoriation: Yes Ecchymosis: No Induration: Yes Erythema: No Rash: No Hemosiderin Staining: No Scarring: Yes Mottled: No Pallor: No Moisture Rubor: No No Abnormalities Noted: No Dry / Scaly: No Maceration: Yes Wound Preparation Ulcer Cleansing:  Rinsed/Irrigated with Saline Topical Anesthetic Applied: Other: lidocaine 4%, Treatment Notes Wound #2 (Right, Distal, Midline Lower Leg) 1. Cleansed with: Clean wound with Normal Saline 2. Anesthetic Topical Lidocaine 4% cream to wound bed prior to debridement 4. Dressing Applied: Other dressing (specify in notes) 5. Secondary Dressing Applied ABD Pad 7. Secured with 3 Layer Compression System - Right Lower Extremity Electronic Signature(s) Signed: 10/21/2017 4:30:41 PM By: Roger Shelter Entered By: Roger Shelter on 10/21/2017 11:24:34 Hallisey, Vickii Williamson ([id number]) -------------------------------------------------------------------------------- Vitals Details Patient Name: Danielle Williamson Date of Service: 10/21/2017 11:00 AM Medical Record Number: [id number] Patient Account Number: [id number] Date of Birth/Sex: February 02, 1932 (85 y.o. Female) Treating RN: Roger Shelter Primary Care Eliel Dudding: Harrel Lemon Other Clinician: Referring Ryne Mctigue: Harrel Lemon Treating Justin Meisenheimer/Extender: Melburn Hake, HOYT Weeks in Treatment: 2 Vital Signs Time Taken: 11:09 Temperature (F): 98.0 Height (in): 66 Pulse (bpm): 72 Source: Stated Respiratory Rate (breaths/min): 18 Blood Pressure (mmHg): 128/53 Reference Range: 80 - 120 mg / dl Electronic Signature(s) Signed: 10/21/2017 4:30:41 PM By: Roger Shelter Entered By: Roger Shelter on 10/21/2017 11:09:39

## 2017-11-04 ENCOUNTER — Encounter: Payer: Medicare Other | Attending: Physician Assistant | Admitting: Physician Assistant

## 2017-11-04 ENCOUNTER — Ambulatory Visit (INDEPENDENT_AMBULATORY_CARE_PROVIDER_SITE_OTHER): Payer: Medicare Other | Admitting: Vascular Surgery

## 2017-11-04 DIAGNOSIS — Z87891 Personal history of nicotine dependence: Secondary | ICD-10-CM | POA: Insufficient documentation

## 2017-11-04 DIAGNOSIS — Z90710 Acquired absence of both cervix and uterus: Secondary | ICD-10-CM | POA: Insufficient documentation

## 2017-11-04 DIAGNOSIS — I110 Hypertensive heart disease with heart failure: Secondary | ICD-10-CM | POA: Insufficient documentation

## 2017-11-04 DIAGNOSIS — I509 Heart failure, unspecified: Secondary | ICD-10-CM | POA: Diagnosis not present

## 2017-11-04 DIAGNOSIS — L97212 Non-pressure chronic ulcer of right calf with fat layer exposed: Secondary | ICD-10-CM | POA: Diagnosis not present

## 2017-11-04 DIAGNOSIS — I739 Peripheral vascular disease, unspecified: Secondary | ICD-10-CM | POA: Diagnosis not present

## 2017-11-04 DIAGNOSIS — X58XXXA Exposure to other specified factors, initial encounter: Secondary | ICD-10-CM | POA: Insufficient documentation

## 2017-11-04 DIAGNOSIS — F410 Panic disorder [episodic paroxysmal anxiety] without agoraphobia: Secondary | ICD-10-CM | POA: Diagnosis not present

## 2017-11-04 DIAGNOSIS — S81811A Laceration without foreign body, right lower leg, initial encounter: Secondary | ICD-10-CM | POA: Insufficient documentation

## 2017-11-04 DIAGNOSIS — M069 Rheumatoid arthritis, unspecified: Secondary | ICD-10-CM | POA: Diagnosis not present

## 2017-11-04 DIAGNOSIS — F329 Major depressive disorder, single episode, unspecified: Secondary | ICD-10-CM | POA: Diagnosis not present

## 2017-11-04 DIAGNOSIS — I890 Lymphedema, not elsewhere classified: Secondary | ICD-10-CM | POA: Insufficient documentation

## 2017-11-04 DIAGNOSIS — J449 Chronic obstructive pulmonary disease, unspecified: Secondary | ICD-10-CM | POA: Insufficient documentation

## 2017-11-05 NOTE — Progress Notes (Addendum)
KATHLINE, [place]AN[place] ([id number]) Visit Report for 11/04/2017 Chief Complaint Document Details Patient Name: Danielle Williamson, Danielle Williamson Date of Service: 11/04/2017 11:00 AM Medical Record Number: [id number] Patient Account Number: [id number] Date of Birth/Sex: 06-Dec-1931 (84 y.o. Female) Treating RN: Roger Shelter Primary Care Provider: Harrel Lemon Other Clinician: Referring Provider: Harrel Lemon Treating Provider/Extender: Melburn Hake, Sammy Cassar Weeks in Treatment: 4 Information Obtained from: Patient Chief Complaint Patient seen for complaints of Non-Healing Wound to the right lower extremity which was caused by an accidental fall Electronic Signature(s) Signed: 11/05/2017 10:30:07 AM By: Worthy Keeler PA-C Entered By: Worthy Keeler on 11/04/2017 11:27:01 Iddings, Danielle Chafe ([id number]) -------------------------------------------------------------------------------- Debridement Details Patient Name: Danielle Williamson Date of Service: 11/04/2017 11:00 AM Medical Record Number: [id number] Patient Account Number: [id number] Date of Birth/Sex: [birth year] (84 y.o. Female) Treating RN: Roger Shelter Primary Care Provider: Harrel Lemon Other Clinician: Referring Provider: Harrel Lemon Treating Provider/Extender: Melburn Hake, Kyleigh Nannini Weeks in Treatment: 4 Debridement Performed for Wound #2 Right,Distal,Midline Lower Leg Assessment: Performed By: Physician STONE III, Shalimar Mcclain E., PA-C Debridement: Debridement Pre-procedure Verification/Time Yes - 11:40 Out Taken: Start Time: 11:40 Pain Control: Other : lidocaine 4% Level: Skin/Subcutaneous Tissue Total Area Debrided (L x W): 1.1 (cm) x 0.3 (cm) = 0.33 (cm) Tissue and other material Viable, Non-Viable, Eschar, Fibrin/Slough, Skin, Subcutaneous debrided: Instrument: Curette Bleeding: Minimum Hemostasis Achieved: Pressure End Time: 11:42 Procedural Pain: 2 Post Procedural Pain: 0 Response to Treatment: Procedure was tolerated well Post  Debridement Measurements of Total Wound Length: (cm) 2.3 Width: (cm) 0.6 Depth: (cm) 0.3 Volume: (cm) 0.325 Character of Wound/Ulcer Post Debridement: Stable Post Procedure Diagnosis Same as Pre-procedure Electronic Signature(s) Signed: 11/04/2017 4:39:42 PM By: Roger Shelter Signed: 11/05/2017 10:30:07 AM By: Worthy Keeler PA-C Entered By: Roger Shelter on 11/04/2017 11:45:00 Danielle Williamson, Danielle Chafe ([id number]) -------------------------------------------------------------------------------- HPI Details Patient Name: Danielle Williamson Date of Service: 11/04/2017 11:00 AM Medical Record Number: [id number] Patient Account Number: [id number] Date of Birth/Sex: [birth year] (84 y.o. Female) Treating RN: Roger Shelter Primary Care Provider: Harrel Lemon Other Clinician: Referring Provider: Harrel Lemon Treating Provider/Extender: Melburn Hake, Sebastian Lurz Weeks in Treatment: 4 History of Present Illness Location: right lower leg Quality: Patient reports experiencing a dull pain to affected area(s). Severity: Patient states wound are getting worse. Duration: Patient has had the wound for < 2 weeks prior to presenting for treatment Timing: Pain in wound is constant (hurts all the time) Context: The wound occurred when the patient had a fall and had significant skin tears and went to the ER Modifying Factors: Other treatment(s) tried include:the skin was placed back and Steri-Stripped Associated Signs and Symptoms: Patient reports having increase swelling. HPI Description: 84 year old patient has been seen recently by Dr. Hortencia Pilar of [facility] for leg pain and swelling associated with venous ulceration. She had been seen and treated with Unna boot therapy and the swelling got worse and there was discoloration of the leg. he recommended continuing compression stockings and elevation and exercise and he would not do any surgery at the present time. She should also continue  with a lymphedema pump. he also noted evidence of arthrosclerosis of the lower extremity with claudication and noninvasive studies do not suggest clinically significant change. Noninvasive studies, angiogram or surgery at this time. He recommended continuing walking and more formal exercise program. The arterial studies done in September of this year showed a normal ABI bilaterally with the right being 1.03 and the left being 1.14 with triphasic flow bilaterally. She had a  DVT study done in August 2018 but I do not see any note off of venous reflux study being done. past medical history significant for COPD, depression, hypertension, panic attacks, peripheral vascular disease, status post cholecystectomy, colonoscopy, hysterectomy and right fourth toe surgery.she quit smoking about 4 years ago and does not drink alcohol. Most recently she was seen on 10/02/2017 after a fall and a skin tear on her right leg with no evidence of fracture.the skin tear was approximated with Steri-Strips and a Xeroform gauze dressing and a tetanus toxoid is up-to-date. 10/21/17 on evaluation today patient is seen for follow-up concerning her right lower extremity skin tears. She overall does have a dull pain to the area which is a much more sharp/burning discomfort with cleansing. She has extremely fragile skin and is prone to injury of this nature she states. The good news is the more proximal ulceration seems to be doing much better at this point and in fact is healing quite nicely. The inferior ulceration did have a lot of loose skin which was cleansed from around the wound. With that being said there is some Slough noted in the central aspect of the wound as well although due to the pain which is more significant with cleansing I was unable to perform any debridement today. No fevers, chills, nausea, or vomiting noted at this time. 11/04/17 on evaluation today patient's wounds appear to be doing much better. Her  proximal wound appears likely to be completely closed although there was one small area I am still wondering if there was an opening this definitely does not appear to be significant. Subsequently them greatly in regard to the inferior wound there is some Slough noted in the inferior aspect of the wound bed that does need and required debridement. She still continues to be somewhat tender to palpation and even light touch. Electronic Signature(s) Signed: 11/05/2017 10:30:07 AM By: Worthy Keeler PA-C Entered By: Worthy Keeler on 11/04/2017 11:49:24 Danielle Williamson, Danielle Williamson ([id number]) -------------------------------------------------------------------------------- Physical Exam Details Patient Name: Danielle Williamson Date of Service: 11/04/2017 11:00 AM Medical Record Number: [id number] Patient Account Number: [id number] Date of Birth/Sex: [birth year] (84 y.o. Female) Treating RN: Roger Shelter Primary Care Provider: Harrel Lemon Other Clinician: Referring Provider: Harrel Lemon Treating Provider/Extender: STONE III, Divit Danielle Williamson Weeks in Treatment: 4 Constitutional Well-nourished and well-hydrated in no acute distress. Respiratory normal breathing without difficulty. Psychiatric this patient is able to make decisions and demonstrates good insight into disease process. Alert and Oriented x 3. pleasant and cooperative. Notes Patient does not appear to have any evidence of infection in regard to the wound bed at this point. No fevers, chills, nausea, or vomiting noted at this time. It also does not appear to be any purulent discharge although the inferior half of the inferior wound was covered with slough and did required debridement which patient consented to today. She did have some discomfort with debridement but tolerated this overall fairly well at this point. Post debridement the wound appeared to be much improved. Electronic Signature(s) Signed: 11/05/2017 10:30:07 AM By: Worthy Keeler  PA-C Entered By: Worthy Keeler on 11/04/2017 11:50:49 Danielle Williamson, Danielle Williamson ([id number]) -------------------------------------------------------------------------------- Physician Orders Details Patient Name: Danielle Williamson Date of Service: 11/04/2017 11:00 AM Medical Record Number: [id number] Patient Account Number: [id number] Date of Birth/Sex: 12/01/1931 (84 y.o. Female) Treating RN: Roger Shelter Primary Care Provider: Harrel Lemon Other Clinician: Referring Provider: Harrel Lemon Treating Provider/Extender: Melburn Hake, Gradyn Shein Weeks in Treatment: 4 Verbal / Phone Orders: No  Diagnosis Coding ICD-10 Coding Code Description [phone] Laceration without foreign body, right lower leg, initial encounter L97.212 Non-pressure chronic ulcer of right calf with fat layer exposed I89.0 Lymphedema, not elsewhere classified Wound Cleansing Wound #1 Right,Proximal,Midline Lower Leg o Clean wound with Normal Saline. Wound #2 Right,Distal,Midline Lower Leg o Clean wound with Normal Saline. Anesthetic (add to Medication List) Wound #1 Right,Proximal,Midline Lower Leg o Topical Lidocaine 4% cream applied to wound bed prior to debridement (In Clinic Only). Wound #2 Right,Distal,Midline Lower Leg o Topical Lidocaine 4% cream applied to wound bed prior to debridement (In Clinic Only). Skin Barriers/Peri-Wound Care Wound #2 Right,Distal,Midline Lower Leg o Barrier cream - around wound o Moisturizing lotion - leg and foot Primary Wound Dressing Wound #1 Right,Proximal,Midline Lower Leg o Mepitel One Contact layer Wound #2 Right,Distal,Midline Lower Leg o Other: - silvercell Secondary Dressing Wound #1 Right,Proximal,Midline Lower Leg o ABD pad Wound #2 Right,Distal,Midline Lower Leg o ABD pad Dressing Change Frequency Wound #1 Right,Proximal,Midline Lower Leg o Change dressing every week Danielle Williamson, Danielle H. ([id number]) Wound #2 Right,Distal,Midline Lower Leg o Change  dressing every week Follow-up Appointments o Return Appointment in 2 weeks. Edema Control o 3 Layer Compression System - Right Lower Extremity Home Health Wound #1 Right,Proximal,Midline Lower Leg o Initiate Home Health for [place] Visits o Home Health Nurse may visit PRN to address patientos wound care needs. o FACE TO FACE ENCOUNTER: MEDICARE and MEDICAID PATIENTS: I certify that this patient is under my care and that I had a face-to-face encounter that meets the physician face-to-face encounter requirements with this patient on this date. The encounter with the patient was in whole or in part for the following MEDICAL CONDITION: (primary reason for [place]) MEDICAL NECESSITY: I certify, that based on my findings, NURSING services are a medically necessary home health service. HOME BOUND STATUS: I certify that my clinical findings support that this patient is homebound (i.e., Due to illness or injury, pt requires aid of supportive devices such as crutches, cane, wheelchairs, walkers, the use of special transportation or the assistance of another person to leave their place of residence. There is a normal inability to leave the home and doing so requires considerable and taxing effort. Other absences are for medical reasons / religious services and are infrequent or of short duration when for other reasons). o If current dressing causes regression in wound condition, may D/C ordered dressing product/s and apply Normal Saline Moist Dressing daily until next [place] / Other MD appointment. [place] of regression in wound condition at [phone]. o Please direct any NON-WOUND related issues/requests for orders to patient's Primary Care Physician Wound #2 [place] for [place] Nurse may visit PRN to  address patientos wound care needs. o FACE TO FACE ENCOUNTER: MEDICARE and MEDICAID PATIENTS: I certify that this patient is under my care and that I had a face-to-face encounter that meets the physician face-to-face encounter requirements with this patient on this date. The encounter with the patient was in whole or in part for the following MEDICAL CONDITION: (primary reason for [place]) MEDICAL NECESSITY: I certify, that based on my findings, NURSING services are a medically necessary home health service. HOME BOUND STATUS: I certify that my clinical findings support that this patient is homebound (i.e., Due to illness or injury, pt requires aid of supportive devices such as crutches, cane, wheelchairs,  walkers, the use of special transportation or the assistance of another person to leave their place of residence. There is a normal inability to leave the home and doing so requires considerable and taxing effort. Other absences are for medical reasons / religious services and are infrequent or of short duration when for other reasons). o If current dressing causes regression in wound condition, may D/C ordered dressing product/s and apply Normal Saline Moist Dressing daily until next [place] / Other MD appointment. [place] of regression in wound condition at [phone]. o Please direct any NON-WOUND related issues/requests for orders to patient's Primary Care Physician Electronic Signature(s) Signed: 11/04/2017 4:39:42 PM By: Roger Shelter Signed: 11/05/2017 10:30:07 AM By: Worthy Keeler PA-C Entered By: Roger Shelter on 11/04/2017 12:02:08 Danielle Williamson, Danielle Williamson ([id number]) -------------------------------------------------------------------------------- Problem List Details Patient Name: Danielle Williamson Date of Service: 11/04/2017 11:00 AM Medical Record Number: [id number] Patient Account Number: [id number] Date of Birth/Sex: 1932-03-04 (85  y.o. Female) Treating RN: Roger Shelter Primary Care Provider: Harrel Lemon Other Clinician: Referring Provider: Harrel Lemon Treating Provider/Extender: Melburn Hake, Sarahlynn Cisnero Weeks in Treatment: 4 Active Problems ICD-10 Encounter Code Description Active Date Diagnosis S81.811A Laceration without foreign body, right lower leg, initial encounter 10/07/2017 Yes L97.212 Non-pressure chronic ulcer of right calf with fat layer exposed 10/07/2017 Yes I89.0 Lymphedema, not elsewhere classified 10/07/2017 Yes Inactive Problems Resolved Problems Electronic Signature(s) Signed: 11/05/2017 10:30:07 AM By: Worthy Keeler PA-C Entered By: Worthy Keeler on 11/04/2017 11:26:54 Bensch, Danielle Chafe ([id number]) -------------------------------------------------------------------------------- Progress Note Details Patient Name: Danielle Williamson Date of Service: 11/04/2017 11:00 AM Medical Record Number: [id number] Patient Account Number: [id number] Date of Birth/Sex: 06-Oct-1932 (84 y.o. Female) Treating RN: Roger Shelter Primary Care Provider: Harrel Lemon Other Clinician: Referring Provider: Harrel Lemon Treating Provider/Extender: Melburn Hake, Murl Zogg Weeks in Treatment: 4 Subjective Chief Complaint Information obtained from Patient Patient seen for complaints of Non-Healing Wound to the right lower extremity which was caused by an accidental fall History of Present Illness (HPI) The following HPI elements were documented for the patient's wound: Location: right lower leg Quality: Patient reports experiencing a dull pain to affected area(s). Severity: Patient states wound are getting worse. Duration: Patient has had the wound for < 2 weeks prior to presenting for treatment Timing: Pain in wound is constant (hurts all the time) Context: The wound occurred when the patient had a fall and had significant skin tears and went to the ER Modifying Factors: Other treatment(s) tried include:the skin  was placed back and Steri-Stripped Associated Signs and Symptoms: Patient reports having increase swelling. 84 year old patient has been seen recently by Dr. Hortencia Pilar of [facility] for leg pain and swelling associated with venous ulceration. She had been seen and treated with Unna boot therapy and the swelling got worse and there was discoloration of the leg. he recommended continuing compression stockings and elevation and exercise and he would not do any surgery at the present time. She should also continue with a lymphedema pump. he also noted evidence of arthrosclerosis of the lower extremity with claudication and noninvasive studies do not suggest clinically significant change. Noninvasive studies, angiogram or surgery at this time. He recommended continuing walking and more formal exercise program. The arterial studies done in September of this year showed a normal ABI bilaterally with the right being 1.03 and the left being 1.14 with triphasic flow bilaterally. She had a DVT study done in August 2018 but I do not see any  note off of venous reflux study being done. past medical history significant for COPD, depression, hypertension, panic attacks, peripheral vascular disease, status post cholecystectomy, colonoscopy, hysterectomy and right fourth toe surgery.she quit smoking about 4 years ago and does not drink alcohol. Most recently she was seen on 10/02/2017 after a fall and a skin tear on her right leg with no evidence of fracture.the skin tear was approximated with Steri-Strips and a Xeroform gauze dressing and a tetanus toxoid is up-to-date. 10/21/17 on evaluation today patient is seen for follow-up concerning her right lower extremity skin tears. She overall does have a dull pain to the area which is a much more sharp/burning discomfort with cleansing. She has extremely fragile skin and is prone to injury of this nature she states. The good news is the more  proximal ulceration seems to be doing much better at this point and in fact is healing quite nicely. The inferior ulceration did have a lot of loose skin which was cleansed from around the wound. With that being said there is some Slough noted in the central aspect of the wound as well although due to the pain which is more significant with cleansing I was unable to perform any debridement today. No fevers, chills, nausea, or vomiting noted at this time. 11/04/17 on evaluation today patient's wounds appear to be doing much better. Her proximal wound appears likely to be completely closed although there was one small area I am still wondering if there was an opening this definitely does not appear to be significant. Subsequently them greatly in regard to the inferior wound there is some Slough noted in the inferior aspect of the wound bed that does need and required debridement. She still continues to be somewhat tender to palpation and even light touch. Danielle Williamson, HEGSTROM ([id number]) Patient History Information obtained from Patient. Family History Cancer - Father, Heart Disease - Mother, Hypertension - Mother, Kidney Disease - Father, Stroke - Paternal Grandparents, Thyroid Problems - Father, No family history of Diabetes, Lung Disease, Seizures, Tuberculosis. Social History Former smoker - 4 years ago, Marital Status - Widowed, Alcohol Use - Rarely, Drug Use - No History, Caffeine Use - Daily. Review of Systems (ROS) Constitutional Symptoms (General Health) Denies complaints or symptoms of Fever, Chills. Respiratory The patient has no complaints or symptoms. Cardiovascular Complains or has symptoms of LE edema. Psychiatric The patient has no complaints or symptoms. Objective Constitutional Well-nourished and well-hydrated in no acute distress. Vitals Time Taken: 11:04 AM, Height: 66 in, Temperature: 97.7 F, Pulse: 68 bpm, Respiratory Rate: 18 breaths/min, Blood Pressure: 152/61  mmHg. Respiratory normal breathing without difficulty. Psychiatric this patient is able to make decisions and demonstrates good insight into disease process. Alert and Oriented x 3. pleasant and cooperative. General Notes: Patient does not appear to have any evidence of infection in regard to the wound bed at this point. No fevers, chills, nausea, or vomiting noted at this time. It also does not appear to be any purulent discharge although the inferior half of the inferior wound was covered with slough and did required debridement which patient consented to today. She did have some discomfort with debridement but tolerated this overall fairly well at this point. Post debridement the wound appeared to be much improved. Integumentary (Hair, Skin) Wound #1 status is Open. Original cause of wound was Trauma. The wound is located on the Right,Proximal,Midline Lower Leg. The wound measures 0cm length x 0.1cm width x 0.1cm depth; 0.008cm^2 area and 0.001cm^3 volume. There  is Fat Layer (Subcutaneous Tissue) Exposed exposed. There is no tunneling or undermining noted. There is a medium amount of serosanguineous drainage noted. The wound margin is flat and intact. There is small (1-33%) red granulation within the wound bed. There is a large (67-100%) amount of necrotic tissue within the wound bed including Eschar. The periwound skin appearance exhibited: Induration, Scarring, Maceration. The periwound skin appearance did not exhibit: Callus, Crepitus, Excoriation, Rash, Dry/Scaly, Atrophie Blanche, Cyanosis, Ecchymosis, Hemosiderin Staining, Mottled, Pallor, Rubor, Friday, Danielle H. ([id number]) Erythema. Wound #2 status is Open. Original cause of wound was Trauma. The wound is located on the Right,Distal,Midline Lower Leg. The wound measures 2.3cm length x 0.6cm width x 0.2cm depth; 1.084cm^2 area and 0.217cm^3 volume. There is Fat Layer (Subcutaneous Tissue) Exposed exposed. There is no tunneling or  undermining noted. There is a medium amount of serosanguineous drainage noted. The wound margin is flat and intact. There is medium (34-66%) red, pink granulation within the wound bed. There is a medium (34-66%) amount of necrotic tissue within the wound bed including Eschar and Adherent Slough. The periwound skin appearance exhibited: Excoriation, Induration, Scarring. The periwound skin appearance did not exhibit: Callus, Crepitus, Rash, Dry/Scaly, Maceration, Atrophie Blanche, Cyanosis, Ecchymosis, Hemosiderin Staining, Mottled, Pallor, Rubor, Erythema. Assessment Active Problems ICD-10 S81.811A - Laceration without foreign body, right lower leg, initial encounter L97.212 - Non-pressure chronic ulcer of right calf with fat layer exposed I89.0 - Lymphedema, not elsewhere classified Procedures Wound #2 Pre-procedure diagnosis of Wound #2 is a Trauma, Other located on the Right,Distal,Midline Lower Leg . There was a Skin/Subcutaneous Tissue Debridement ([id number]) debridement with total area of 0.33 sq cm performed by STONE III, Cleve Paolillo E., PA-C. with the following instrument(s): Curette to remove Viable and Non-Viable tissue/material including Fibrin/Slough, Eschar, Skin, and Subcutaneous after achieving pain control using Other (lidocaine 4%). A time out was conducted at 11:40, prior to the start of the procedure. A Minimum amount of bleeding was controlled with Pressure. The procedure was tolerated well with a pain level of 2 throughout and a pain level of 0 following the procedure. Post Debridement Measurements: 2.3cm length x 0.6cm width x 0.3cm depth; 0.325cm^3 volume. Character of Wound/Ulcer Post Debridement is stable. Post procedure Diagnosis Wound #2: Same as Pre-Procedure Plan Wound Cleansing: Wound #1 Right,Proximal,Midline Lower Leg: Clean wound with Normal Saline. Wound #2 Right,Distal,Midline Lower Leg: Clean wound with Normal Saline. Anesthetic (add to Medication  List): Wound #1 Right,Proximal,Midline Lower Leg: Topical Lidocaine 4% cream applied to wound bed prior to debridement (In Clinic Only). Wound #2 Right,Distal,Midline Lower Leg: Topical Lidocaine 4% cream applied to wound bed prior to debridement (In Clinic Only). MINYON, BILLITER ([id number]) Skin Barriers/Peri-Wound Care: Wound #2 Right,Distal,Midline Lower Leg: Barrier cream - around wound Moisturizing lotion - leg and foot Primary Wound Dressing: Wound #1 Right,Proximal,Midline Lower Leg: Mepitel One Contact layer Wound #2 Right,Distal,Midline Lower Leg: Other: - silvercell Secondary Dressing: Wound #1 Right,Proximal,Midline Lower Leg: ABD pad Wound #2 Right,Distal,Midline Lower Leg: ABD pad Dressing Change Frequency: Wound #1 Right,Proximal,Midline Lower Leg: Change dressing every week Wound #2 Right,Distal,Midline Lower Leg: Change dressing every week Follow-up Appointments: Return Appointment in 2 weeks. Edema Control: 3 Layer Compression System - Right Lower Extremity Home Health: Wound #1 Right,Proximal,Midline Lower Leg: [place] for [place] Nurse may visit PRN to address patient s wound care needs. FACE TO FACE ENCOUNTER: MEDICARE and MEDICAID PATIENTS: I certify that this patient is under my care and that  I had a face-to-face encounter that meets the physician face-to-face encounter requirements with this patient on this date. The encounter with the patient was in whole or in part for the following MEDICAL CONDITION: (primary reason for [place]) MEDICAL NECESSITY: I certify, that based on my findings, NURSING services are a medically necessary home health service. HOME BOUND STATUS: I certify that my clinical findings support that this patient is homebound (i.e., Due to illness or injury, pt requires aid of supportive devices such as crutches, cane, wheelchairs, walkers, the use of  special transportation or the assistance of another person to leave their place of residence. There is a normal inability to leave the home and doing so requires considerable and taxing effort. Other absences are for medical reasons / religious services and are infrequent or of short duration when for other reasons). If current dressing causes regression in wound condition, may D/C ordered dressing product/s and apply Normal Saline Moist Dressing daily until next [place] / Other MD appointment. [place] of regression in wound condition at [phone]. Please direct any NON-WOUND related issues/requests for orders to patient's Primary Care Physician Wound #2 Right,Distal,Midline Lower Leg: [place] for [place] Nurse may visit PRN to address patient s wound care needs. FACE TO FACE ENCOUNTER: MEDICARE and MEDICAID PATIENTS: I certify that this patient is under my care and that I had a face-to-face encounter that meets the physician face-to-face encounter requirements with this patient on this date. The encounter with the patient was in whole or in part for the following MEDICAL CONDITION: (primary reason for [place]) MEDICAL NECESSITY: I certify, that based on my findings, NURSING services are a medically necessary home health service. HOME BOUND STATUS: I certify that my clinical findings support that this patient is homebound (i.e., Due to illness or injury, pt requires aid of supportive devices such as crutches, cane, wheelchairs, walkers, the use of special transportation or the assistance of another person to leave their place of residence. There is a normal inability to leave the home and doing so requires considerable and taxing effort. Other absences are for medical reasons / religious services and are infrequent or of short duration when for other reasons). If current dressing causes  regression in wound condition, may D/C ordered dressing product/s and apply Normal Saline Moist Dressing daily until next [place] / Other MD appointment. [place] of regression in wound condition at [phone]. Please direct any NON-WOUND related issues/requests for orders to patient's Primary Care Physician RONELL, BOLDIN ([id number]) I'm going to recommend that we continue with the Current wound care measures as patient appears to be doing well in this regard. We will see her for reevaluation in two weeks time to see where she is at that point. Please see above for specific wound care orders. We will see patient for re-evaluation in 1 week(s) here in the clinic. If anything worsens or changes patient will contact our office for additional recommendations. Electronic Signature(s) Signed: 11/05/2017 4:32:07 PM By: Worthy Keeler PA-C Previous Signature: 11/05/2017 10:30:07 AM Version By: Worthy Keeler PA-C Entered By: Worthy Keeler on 11/05/2017 13:52:31 Fera, Danielle Chafe ([id number]) -------------------------------------------------------------------------------- ROS/PFSH Details Patient Name: Danielle Williamson Date of Service: 11/04/2017 11:00 AM Medical Record Number: [id number] Patient Account Number: [id number] Date of Birth/Sex: 17-Jul-1932 (84 y.o. Female) Treating RN: Roger Shelter Primary Care Provider: Harrel Lemon Other Clinician: Referring Provider: Harrel Lemon  Treating Provider/Extender: STONE III, Zakaria Sedor Weeks in Treatment: 4 Information Obtained From Patient Wound History Do you currently have one or more open woundso Yes How many open wounds do you currently haveo 3 Approximately how long have you had your woundso 6 days Has your wound(s) ever healed and then re-openedo No Have you had any lab work done in the past montho No Have you tested positive for an antibiotic resistant organism (MRSA, VRE)o No Have you tested positive for  osteomyelitis (bone infection)o No Have you had any tests for circulation on your legso Yes Who ordered the testo Dr. Arelia Sneddon Where was the test doneo few months ago Have you had other problems associated with your woundso Swelling Constitutional Symptoms (General Health) Complaints and Symptoms: Negative for: Fever; Chills Cardiovascular Complaints and Symptoms: Positive for: LE edema Medical History: Positive for: Arrhythmia Negative for: Angina; Congestive Heart Failure; Coronary Artery Disease; Deep Vein Thrombosis; Hypertension; Hypotension; Myocardial Infarction; Peripheral Arterial Disease; Peripheral Venous Disease; Phlebitis; Vasculitis Eyes Medical History: Positive for: Cataracts - surgery in past Negative for: Glaucoma; Optic Neuritis Ear/Nose/Mouth/Throat Medical History: Negative for: Chronic sinus problems/congestion; Middle ear problems Hematologic/Lymphatic Medical History: Positive for: Anemia; Lymphedema Negative for: Hemophilia; Human Immunodeficiency Virus; Sickle Cell Disease Respiratory Mcinroy, Yesli H. ([id number]) Complaints and Symptoms: No Complaints or Symptoms Medical History: Positive for: Asthma; Chronic Obstructive Pulmonary Disease (COPD) Negative for: Aspiration; Pneumothorax; Sleep Apnea; Tuberculosis Gastrointestinal Medical History: Negative for: Cirrhosis ; Colitis; Crohnos; Hepatitis A; Hepatitis B; Hepatitis C Endocrine Medical History: Negative for: Type I Diabetes; Type II Diabetes Genitourinary Medical History: Negative for: End Stage Renal Disease Immunological Medical History: Negative for: Lupus Erythematosus; Raynaudos; Scleroderma Integumentary (Skin) Medical History: Negative for: History of Burn; History of pressure wounds Musculoskeletal Medical History: Positive for: Rheumatoid Arthritis Negative for: Gout; Osteoarthritis; Osteomyelitis Neurologic Medical History: Negative for: Dementia; Neuropathy; Quadriplegia;  Paraplegia; Seizure Disorder Oncologic Medical History: Negative for: Received Chemotherapy; Received Radiation Psychiatric Complaints and Symptoms: No Complaints or Symptoms Medical History: Negative for: Anorexia/bulimia; Confinement Anxiety HBO Extended History Items Eyes: Cataracts Lubin, Dove H. ([id number]) Immunizations Pneumococcal Vaccine: Received Pneumococcal Vaccination: Yes Implantable Devices Family and Social History Cancer: Yes - Father; Diabetes: No; Heart Disease: Yes - Mother; Hypertension: Yes - Mother; Kidney Disease: Yes - Father; Lung Disease: No; Seizures: No; Stroke: Yes - Paternal Grandparents; Thyroid Problems: Yes - Father; Tuberculosis: No; Former smoker - 4 years ago; Marital Status - Widowed; Alcohol Use: Rarely; Drug Use: No History; Caffeine Use: Daily; Financial Concerns: No; Food, Clothing or Shelter Needs: No; Support System Lacking: No; Transportation Concerns: No; Advanced Directives: Yes (Not Provided); Patient does not want information on Advanced Directives; Do not resuscitate: Yes (Not Provided); Living Will: Yes (Not Provided); Medical Power of Attorney: Yes (Not Provided) Physician Affirmation I have reviewed and agree with the above information. Electronic Signature(s) Signed: 11/04/2017 4:39:42 PM By: Roger Shelter Signed: 11/05/2017 10:30:07 AM By: Worthy Keeler PA-C Entered By: Worthy Keeler on 11/04/2017 11:50:23 Stineman, Danielle Chafe ([id number]) -------------------------------------------------------------------------------- SuperBill Details Patient Name: Danielle Williamson Date of Service: 11/04/2017 Medical Record Number: [id number] Patient Account Number: [id number] Date of Birth/Sex: 1932/01/09 (84 y.o. Female) Treating RN: Roger Shelter Primary Care Provider: Harrel Lemon Other Clinician: Referring Provider: Harrel Lemon Treating Provider/Extender: Melburn Hake, Yisroel [place] Weeks in Treatment: 4 Diagnosis Coding ICD-10  Codes Code Description [phone] Laceration without foreign body, right lower leg, initial encounter L97.212 Non-pressure chronic ulcer of right calf with fat layer exposed I89.0 Lymphedema, not elsewhere classified Facility Procedures  CPT4 Code: [id number] Description: 67737 - DEB SUBQ TISSUE 20 SQ CM/< ICD-10 Diagnosis Description L97.212 Non-pressure chronic ulcer of right calf with fat layer exp Modifier: osed Quantity: 1 Physician Procedures CPT4 Code: [id number] Description: 47076 - WC PHYS SUBQ TISS 20 SQ CM ICD-10 Diagnosis Description J51.834 Non-pressure chronic ulcer of right calf with fat layer exp Modifier: osed Quantity: 1 Electronic Signature(s) Signed: 11/05/2017 10:30:07 AM By: Worthy Keeler PA-C Entered By: Worthy Keeler on 11/04/2017 11:51:38

## 2017-11-05 NOTE — Progress Notes (Signed)
ZHOEY, BLACKSTOCK ([id number]) Visit Report for 11/04/2017 Arrival Information Details Patient Name: Danielle Williamson, Danielle Williamson Date of Service: 11/04/2017 11:00 AM Medical Record Number: [id number] Patient Account Number: [id number] Date of Birth/Sex: 20-Apr-1932 (85 y.o. Female) Treating RN: Roger Shelter Primary Care Fabrizio Filip: Harrel Lemon Other Clinician: Referring Librada Castronovo: Harrel Lemon Treating Dianca Owensby/Extender: Melburn Hake, HOYT Weeks in Treatment: 4 Visit Information History Since Last Visit All ordered tests and consults were completed: No Patient Arrived: Ambulatory Added or deleted any medications: No Arrival Time: 11:01 Any new allergies or adverse reactions: No Accompanied By: self Had a fall or experienced change in No Transfer Assistance: None activities of daily living that may affect Patient Identification Verified: Yes risk of falls: Secondary Verification Process Completed: Yes Signs or symptoms of abuse/neglect since last visito No Patient Requires Transmission-Based No Hospitalized since last visit: No Precautions: Pain Present Now: Yes Patient Has Alerts: Yes Patient Alerts: right ABI- 1.21 Left ABI- 1.14 Electronic Signature(s) Signed: 11/04/2017 4:39:42 PM By: Roger Shelter Entered By: Roger Shelter on 11/04/2017 11:02:16 Babino, [name] ([id number]) -------------------------------------------------------------------------------- Encounter Discharge Information Details Patient Name: Danielle Williamson Date of Service: 11/04/2017 11:00 AM Medical Record Number: [id number] Patient Account Number: [id number] Date of Birth/Sex: 01/15/1932 (85 y.o. Female) Treating RN: Roger Shelter Primary Care Gibson Telleria: Harrel Lemon Other Clinician: Referring Christopher Hink: Harrel Lemon Treating Lodema Parma/Extender: Melburn Hake, HOYT Weeks in Treatment: 4 Encounter Discharge Information Items Discharge Pain Level: 0 Discharge Condition: Stable Ambulatory Status:  Ambulatory Discharge Destination: Home Transportation: Private Auto Accompanied By: friend Schedule Follow-up Appointment: Yes Medication Reconciliation completed and No provided to Patient/Care Jury Caserta: Provided on Clinical Summary of Care: 11/04/2017 Form Type Recipient Paper Patient [place] Electronic Signature(s) Signed: 11/04/2017 4:39:42 PM By: Roger Shelter Entered By: Roger Shelter on 11/04/2017 12:01:04 Bowler, [name] ([id number]) -------------------------------------------------------------------------------- Lower Extremity Assessment Details Patient Name: Danielle Williamson Date of Service: 11/04/2017 11:00 AM Medical Record Number: [id number] Patient Account Number: [id number] Date of Birth/Sex: April 01, 1932 (85 y.o. Female) Treating RN: Roger Shelter Primary Care Asmi Fugere: Harrel Lemon Other Clinician: Referring Agamjot Kilgallon: Harrel Lemon Treating Destine Zirkle/Extender: Melburn Hake, HOYT Weeks in Treatment: 4 Edema Assessment Assessed: [Left: No] [Right: No] Edema: [Left: Ye] [Right: s] Vascular Assessment Claudication: Claudication Assessment [Right:None] Pulses: Dorsalis Pedis Palpable: [Right:Yes] Posterior Tibial Extremity colors, hair growth, and conditions: Extremity Color: [Right:Hyperpigmented] Hair Growth on Extremity: [Right:Yes] Temperature of Extremity: [Right:Cool] Capillary Refill: [Right:< 3 seconds] Toe Nail Assessment Left: Right: Thick: No Discolored: No Deformed: No Improper Length and Hygiene: No Electronic Signature(s) Signed: 11/04/2017 4:39:42 PM By: Roger Shelter Entered By: Roger Shelter on 11/04/2017 11:24:59 Allshouse, [name] ([id number]) -------------------------------------------------------------------------------- Multi Wound Chart Details Patient Name: Danielle Williamson Date of Service: 11/04/2017 11:00 AM Medical Record Number: [id number] Patient Account Number: [id number] Date of Birth/Sex: 01-28-1932 (85 y.o.  Female) Treating RN: Roger Shelter Primary Care Avigail Pilling: Harrel Lemon Other Clinician: Referring Maddon Horton: Harrel Lemon Treating Kahlil Cowans/Extender: Melburn Hake, HOYT Weeks in Treatment: 4 Vital Signs Height(in): 60 Pulse(bpm): 47 Weight(lbs): Blood Pressure(mmHg): 152/61 Body Mass Index(BMI): Temperature(F): 97.7 Respiratory Rate 18 (breaths/min): Photos: [1:No Photos] [2:No Photos] [N/A:N/A] Wound Location: [1:Right Lower Leg - Midline, Proximal] [2:Right Lower Leg - Midline, Distal] [N/A:N/A] Wounding Event: [1:Trauma] [2:Trauma] [N/A:N/A] Primary Etiology: [1:Trauma, Other] [2:Trauma, Other] [N/A:N/A] Comorbid History: [1:Cataracts, Anemia, Lymphedema, Asthma, Chronic Obstructive Pulmonary Disease (COPD), Arrhythmia, Rheumatoid Arthritis] [2:Cataracts, Anemia, Lymphedema, Asthma, Chronic Obstructive Pulmonary Disease (COPD), Arrhythmia, Rheumatoid  Arthritis] [N/A:N/A] Date Acquired: [1:10/02/2017] [2:10/02/2017] [N/A:N/A] Weeks of Treatment: [1:4] [2:4] [N/A:N/A] Wound Status: [1:Open] [2:Open] [N/A:N/A] Measurements L  x W x D [1:1.1x1.6x0.2] [2:2.3x0.6x0.2] [N/A:N/A] (cm) Area (cm) : [1:1.382] [2:1.084] [N/A:N/A] Volume (cm) : [1:0.276] [4:5.809] [N/A:N/A] % Reduction in Area: [1:85.00%] [2:71.90%] [N/A:N/A] % Reduction in Volume: [1:70.10%] [2:43.80%] [N/A:N/A] Classification: [1:Partial Thickness] [2:Partial Thickness] [N/A:N/A] Exudate Amount: [1:Medium] [2:Medium] [N/A:N/A] Exudate Type: [1:Serosanguineous] [2:Serosanguineous] [N/A:N/A] Exudate Color: [1:red, brown] [2:red, brown] [N/A:N/A] Wound Margin: [1:Flat and Intact] [2:Flat and Intact] [N/A:N/A] Granulation Amount: [1:Small (1-33%)] [2:Medium (34-66%)] [N/A:N/A] Granulation Quality: [1:Red] [2:Red, Pink] [N/A:N/A] Necrotic Amount: [1:Large (67-100%)] [2:Medium (34-66%)] [N/A:N/A] Necrotic Tissue: [1:Eschar] [2:Eschar, Adherent Slough] [N/A:N/A] Exposed Structures: [1:Fat Layer (Subcutaneous  Tissue) Exposed: Yes] [2:Fat Layer (Subcutaneous Tissue) Exposed: Yes] [N/A:N/A] Epithelialization: [1:Small (1-33%)] [2:Small (1-33%)] [N/A:N/A] Periwound Skin Texture: [1:Induration: Yes Scarring: Yes Excoriation: No Callus: No] [2:Excoriation: Yes Induration: Yes Scarring: Yes Callus: No] [N/A:N/A] Crepitus: No Crepitus: No Rash: No Rash: No Periwound Skin Moisture: Maceration: Yes Maceration: No N/A Dry/Scaly: No Dry/Scaly: No Periwound Skin Color: Atrophie Blanche: No Atrophie Blanche: No N/A Cyanosis: No Cyanosis: No Ecchymosis: No Ecchymosis: No Erythema: No Erythema: No Hemosiderin Staining: No Hemosiderin Staining: No Mottled: No Mottled: No Pallor: No Pallor: No Rubor: No Rubor: No Tenderness on Palpation: No No N/A Wound Preparation: Ulcer Cleansing: Ulcer Cleansing: N/A Rinsed/Irrigated with Saline Rinsed/Irrigated with Saline Topical Anesthetic Applied: Topical Anesthetic Applied: Other: lidocaine 4% Other: lidocaine 4% Treatment Notes Electronic Signature(s) Signed: 11/04/2017 4:39:42 PM By: Roger Shelter Entered By: Roger Shelter on 11/04/2017 11:32:29 TOMEEKA, PLAUGHER ([id number]) -------------------------------------------------------------------------------- Multi-Disciplinary Care Plan Details Patient Name: Danielle Williamson Date of Service: 11/04/2017 11:00 AM Medical Record Number: [id number] Patient Account Number: [id number] Date of Birth/Sex: 05-11-1932 (85 y.o. Female) Treating RN: Roger Shelter Primary Care Medora Roorda: Harrel Lemon Other Clinician: Referring Contina Strain: Harrel Lemon Treating Marirose Deveney/Extender: Melburn Hake, HOYT Weeks in Treatment: 4 Active Inactive ` Orientation to the Wound Care Program Nursing Diagnoses: Knowledge deficit related to the wound healing center program Goals: Patient/caregiver will verbalize understanding of the [place] Program Date Initiated: 10/07/2017 Target Resolution Date:  11/07/2017 Goal Status: Active Interventions: Provide education on orientation to the wound center Notes: ` Wound/Skin Impairment Nursing Diagnoses: Impaired tissue integrity Knowledge deficit related to ulceration/compromised skin integrity Goals: Patient/caregiver will verbalize understanding of skin care regimen Date Initiated: 10/07/2017 Target Resolution Date: 11/07/2017 Goal Status: Active Ulcer/skin breakdown will have a volume reduction of 30% by week 4 Date Initiated: 10/07/2017 Target Resolution Date: 11/07/2017 Goal Status: Active Interventions: Assess patient/caregiver ability to obtain necessary supplies Assess ulceration(s) every visit Provide education on ulcer and skin care Treatment Activities: Skin care regimen initiated : 10/07/2017 Notes: Electronic Signature(s) Signed: 11/04/2017 4:39:42 PM By: Lisette Abu ([id number]) Entered By: Roger Shelter on 11/04/2017 11:32:07 Amaro, [name] ([id number]) -------------------------------------------------------------------------------- Pain Assessment Details Patient Name: Danielle Williamson Date of Service: 11/04/2017 11:00 AM Medical Record Number: [id number] Patient Account Number: [id number] Date of Birth/Sex: 04-16-1932 (85 y.o. Female) Treating RN: Roger Shelter Primary Care Skylor Schnapp: Harrel Lemon Other Clinician: Referring Gotham Raden: Harrel Lemon Treating Meztli Llanas/Extender: Melburn Hake, HOYT Weeks in Treatment: 4 Active Problems Location of Pain Severity and Description of Pain Patient Has Paino No Site Locations Duration of the Pain. Constant / Intermittento Intermittent Rate the pain. Current Pain Level: 2 Character of Pain Describe the Pain: Shooting Pain Management and Medication Current Pain Management: Notes states feels like "lighting strike" at times Electronic Signature(s) Signed: 11/04/2017 4:39:42 PM By: Roger Shelter Entered By: Roger Shelter on 11/04/2017  11:02:52 Nunnelley, [name] ([id number]) -------------------------------------------------------------------------------- Patient/Caregiver Education Details Patient Name: Danielle Williamson.  Date of Service: 11/04/2017 11:00 AM Medical Record Number: [id number] Patient Account Number: [id number] Date of Birth/Gender: [birth year] (85 y.o. Female) Treating RN: Roger Shelter Primary Care Physician: Harrel Lemon Other Clinician: Referring Physician: Harrel Lemon Treating Physician/Extender: Sharalyn Ink in Treatment: 4 Education Assessment Education Provided To: Patient Education Topics Provided Wound Debridement: Handouts: Wound Debridement Methods: Explain/Verbal Responses: State content correctly Wound/Skin Impairment: Handouts: Caring for Your Ulcer Methods: Explain/Verbal Responses: State content correctly Electronic Signature(s) Signed: 11/04/2017 4:39:42 PM By: Roger Shelter Entered By: Roger Shelter on 11/04/2017 12:01:20 Jons, [name] ([id number]) -------------------------------------------------------------------------------- Wound Assessment Details Patient Name: Danielle Williamson Date of Service: 11/04/2017 11:00 AM Medical Record Number: [id number] Patient Account Number: [id number] Date of Birth/Sex: 09-15-1932 (85 y.o. Female) Treating RN: Roger Shelter Primary Care Paolina Karwowski: Harrel Lemon Other Clinician: Referring Cagney Steenson: Harrel Lemon Treating Lori Liew/Extender: Melburn Hake, HOYT Weeks in Treatment: 4 Wound Status Wound Number: 1 Primary Trauma, Other Etiology: Wound Location: Right, Proximal, Midline Lower Leg Wound Open Wounding Event: Trauma Status: Date Acquired: 10/02/2017 Comorbid Cataracts, Anemia, Lymphedema, Asthma, Weeks Of Treatment: 4 History: Chronic Obstructive Pulmonary Disease Clustered Wound: No (COPD), Arrhythmia, Rheumatoid Arthritis Photos Photo Uploaded By: Roger Shelter on 11/04/2017 12:43:33 Wound  Measurements Length: (cm) 0 Width: (cm) 0.1 Depth: (cm) 0.1 Area: (cm) 0.008 Volume: (cm) 0.001 % Reduction in Area: 99.9% % Reduction in Volume: 99.9% Epithelialization: Small (1-33%) Tunneling: No Undermining: No Wound Description Classification: Partial Thickness Wound Margin: Flat and Intact Exudate Amount: Medium Exudate Type: Serosanguineous Exudate Color: red, brown Foul Odor After Cleansing: No Slough/Fibrino Yes Wound Bed Granulation Amount: Small (1-33%) Exposed Structure Granulation Quality: Red Fat Layer (Subcutaneous Tissue) Exposed: Yes Necrotic Amount: Large (67-100%) Necrotic Quality: Eschar Periwound Skin Texture Texture Color No Abnormalities Noted: No No Abnormalities Noted: No Callus: No Atrophie Blanche: No [place], Nasira H. ([id number]) Crepitus: No Cyanosis: No Excoriation: No Ecchymosis: No Induration: Yes Erythema: No Rash: No Hemosiderin Staining: No Scarring: Yes Mottled: No Pallor: No Moisture Rubor: No No Abnormalities Noted: No Dry / Scaly: No Maceration: Yes Wound Preparation Ulcer Cleansing: Rinsed/Irrigated with Saline Topical Anesthetic Applied: Other: lidocaine 4%, Treatment Notes Wound #1 (Right, Proximal, Midline Lower Leg) 1. Cleansed with: Clean wound with Normal Saline 2. Anesthetic Topical Lidocaine 4% cream to wound bed prior to debridement 4. Dressing Applied: Mepitel 7. Secured with 3 Layer Compression System - Right Lower Extremity Notes mepitel pad Electronic Signature(s) Signed: 11/04/2017 4:39:42 PM By: Roger Shelter Entered By: Roger Shelter on 11/04/2017 11:33:53 Calamari, [name] ([id number]) -------------------------------------------------------------------------------- Wound Assessment Details Patient Name: Danielle Williamson Date of Service: 11/04/2017 11:00 AM Medical Record Number: [id number] Patient Account Number: [id number] Date of Birth/Sex: [birth year] (85 y.o. Female) Treating RN:  Roger Shelter Primary Care Janashia Parco: Harrel Lemon Other Clinician: Referring Reni Hausner: Harrel Lemon Treating Glendia Olshefski/Extender: Melburn Hake, HOYT Weeks in Treatment: 4 Wound Status Wound Number: 2 Primary Trauma, Other Etiology: Wound Location: Right Lower Leg - Midline, Distal Wound Open Wounding Event: Trauma Status: Date Acquired: 10/02/2017 Comorbid Cataracts, Anemia, Lymphedema, Asthma, Weeks Of Treatment: 4 History: Chronic Obstructive Pulmonary Disease Clustered Wound: No (COPD), Arrhythmia, Rheumatoid Arthritis Photos Photo Uploaded By: Roger Shelter on 11/04/2017 12:43:34 Wound Measurements Length: (cm) 2.3 Width: (cm) 0.6 Depth: (cm) 0.2 Area: (cm) 1.084 Volume: (cm) 0.217 % Reduction in Area: 71.9% % Reduction in Volume: 43.8% Epithelialization: Small (1-33%) Tunneling: No Undermining: No Wound Description Classification: Partial Thickness Wound Margin: Flat and Intact Exudate Amount: Medium Exudate Type: Serosanguineous Exudate Color: red, brown Foul Odor After Cleansing:  No Slough/Fibrino Yes Wound Bed Granulation Amount: Medium (34-66%) Exposed Structure Granulation Quality: Red, Pink Fat Layer (Subcutaneous Tissue) Exposed: Yes Necrotic Amount: Medium (34-66%) Necrotic Quality: Eschar, Adherent Slough Periwound Skin Texture Texture Color No Abnormalities Noted: No No Abnormalities Noted: No Callus: No Atrophie Blanche: No Kohlmeyer, Jillane H. ([id number]) Crepitus: No Cyanosis: No Excoriation: Yes Ecchymosis: No Induration: Yes Erythema: No Rash: No Hemosiderin Staining: No Scarring: Yes Mottled: No Pallor: No Moisture Rubor: No No Abnormalities Noted: No Dry / Scaly: No Maceration: No Wound Preparation Ulcer Cleansing: Rinsed/Irrigated with Saline Topical Anesthetic Applied: Other: lidocaine 4%, Treatment Notes Wound #2 (Right, Distal, Midline Lower Leg) 1. Cleansed with: Clean wound with Normal Saline 2.  Anesthetic Topical Lidocaine 4% cream to wound bed prior to debridement 3. Peri-wound Care: Barrier cream Moisturizing lotion 4. Dressing Applied: Other dressing (specify in notes) 5. Secondary Dressing Applied ABD Pad 7. Secured with 3 Layer Compression System - Right Lower Extremity Electronic Signature(s) Signed: 11/04/2017 4:39:42 PM By: Roger Shelter Entered By: Roger Shelter on 11/04/2017 11:24:17 Augustus, [name] ([id number]) -------------------------------------------------------------------------------- Vitals Details Patient Name: Danielle Williamson Date of Service: 11/04/2017 11:00 AM Medical Record Number: [id number] Patient Account Number: [id number] Date of Birth/Sex: [birth year] (85 y.o. Female) Treating RN: Roger Shelter Primary Care Talajah Slimp: Harrel Lemon Other Clinician: Referring Hayla Hinger: Harrel Lemon Treating Janthony Holleman/Extender: Melburn Hake, HOYT Weeks in Treatment: 4 Vital Signs Time Taken: 11:04 Temperature (F): 97.7 Height (in): 66 Pulse (bpm): 68 Respiratory Rate (breaths/min): 18 Blood Pressure (mmHg): 152/61 Reference Range: 80 - 120 mg / dl Electronic Signature(s) Signed: 11/04/2017 4:39:42 PM By: Roger Shelter Entered By: Roger Shelter on 11/04/2017 11:06:47

## 2017-11-18 ENCOUNTER — Encounter: Payer: Medicare Other | Admitting: Physician Assistant

## 2017-11-18 DIAGNOSIS — S81811A Laceration without foreign body, right lower leg, initial encounter: Secondary | ICD-10-CM | POA: Diagnosis not present

## 2017-11-19 NOTE — Progress Notes (Signed)
Danielle Williamson ([id number]) Visit Report for 11/18/2017 Arrival Information Details Patient Name: Danielle Williamson, Danielle Williamson Date of Service: 11/18/2017 11:00 AM Medical Record Number: [id number] Patient Account Number: [id number] Date of Birth/Sex: 12/03/1931 (84 y.o. Female) Treating RN: Montey Hora Primary Care Maxamillion Banas: Harrel Lemon Other Clinician: Referring Savan Ruta: Harrel Lemon Treating Titilayo Hagans/Extender: Melburn Hake, HOYT Weeks in Treatment: 6 Visit Information History Since Last Visit Added or deleted any medications: No Patient Arrived: Ambulatory Any new allergies or adverse reactions: No Arrival Time: 11:25 Had a fall or experienced change in No Accompanied By: sister activities of daily living that may affect Transfer Assistance: None risk of falls: Patient Identification Verified: Yes Signs or symptoms of abuse/neglect since last visito No Secondary Verification Process Completed: Yes Hospitalized since last visit: No Patient Requires Transmission-Based No Has Dressing in Place as Prescribed: Yes Precautions: Has Compression in Place as Prescribed: Yes Patient Has Alerts: Yes Pain Present Now: No Patient Alerts: right ABI- 1.21 Left ABI- 1.14 Electronic Signature(s) Signed: 11/18/2017 5:01:42 PM By: Montey Hora Entered By: Montey Hora on 11/18/2017 11:32:14 Denker, [name] ([id number]) -------------------------------------------------------------------------------- Compression Therapy Details Patient Name: Danielle Williamson Date of Service: 11/18/2017 11:00 AM Medical Record Number: [id number] Patient Account Number: [id number] Date of Birth/Sex: 02/21/1932 (84 y.o. Female) Treating RN: Montey Hora Primary Care Alegandro Macnaughton: Harrel Lemon Other Clinician: Referring Tempest Frankland: Harrel Lemon Treating Akio Hudnall/Extender: Melburn Hake, HOYT Weeks in Treatment: 6 Compression Therapy Performed for Wound Assessment: Wound #2 Right,Distal,Midline Lower Leg Performed  By: Clinician Montey Hora, RN Compression Type: Three Layer Post Procedure Diagnosis Same as Pre-procedure Electronic Signature(s) Signed: 11/18/2017 3:53:44 PM By: Montey Hora Entered By: Montey Hora on 11/18/2017 15:53:44 Mordan, [name] ([id number]) -------------------------------------------------------------------------------- Encounter Discharge Information Details Patient Name: Danielle Williamson Date of Service: 11/18/2017 11:00 AM Medical Record Number: [id number] Patient Account Number: [id number] Date of Birth/Sex: 11-12-1931 (84 y.o. Female) Treating RN: Montey Hora Primary Care Tempie Gibeault: Harrel Lemon Other Clinician: Referring Elaiza Shoberg: Harrel Lemon Treating Maddalynn Barnard/Extender: Melburn Hake, HOYT Weeks in Treatment: 6 Encounter Discharge Information Items Discharge Pain Level: 0 Discharge Condition: Stable Ambulatory Status: Ambulatory Discharge Destination: Home Transportation: Private Auto Accompanied By: self Schedule Follow-up Appointment: Yes Medication Reconciliation completed and No provided to Patient/Care Bradan Congrove: Provided on Clinical Summary of Care: 11/18/2017 Form Type Recipient Paper Patient  Electronic Signature(s) Signed: 11/18/2017 4:34:28 PM By: Montey Hora Entered By: Montey Hora on 11/18/2017 16:34:27 Hoefling, [name] ([id number]) -------------------------------------------------------------------------------- Lower Extremity Assessment Details Patient Name: Danielle Williamson Date of Service: 11/18/2017 11:00 AM Medical Record Number: [id number] Patient Account Number: [id number] Date of Birth/Sex: [birth year] (84 y.o. Female) Treating RN: Montey Hora Primary Care Zamariya Neal: Harrel Lemon Other Clinician: Referring Melanni Benway: Harrel Lemon Treating Rosezetta Balderston/Extender: Melburn Hake, HOYT Weeks in Treatment: 6 Vascular Assessment Pulses: Dorsalis Pedis Palpable: [Right:Yes] Posterior Tibial Extremity colors, hair growth, and  conditions: Extremity Color: [Right:Mottled] Hair Growth on Extremity: [Right:Yes] Temperature of Extremity: [Right:Warm] Capillary Refill: [Right:< 3 seconds] Electronic Signature(s) Signed: 11/18/2017 5:01:42 PM By: Montey Hora Entered By: Montey Hora on 11/18/2017 11:38:23 Schicker, [name] ([id number]) -------------------------------------------------------------------------------- Multi Wound Chart Details Patient Name: Danielle Williamson Date of Service: 11/18/2017 11:00 AM Medical Record Number: [id number] Patient Account Number: [id number] Date of Birth/Sex: [birth year] (84 y.o. Female) Treating RN: Montey Hora Primary Care Lorali Khamis: Harrel Lemon Other Clinician: Referring Asmaa Tirpak: Harrel Lemon Treating Deashia Soule/Extender: Melburn Hake, HOYT Weeks in Treatment: 6 Vital Signs Height(in): 66 Pulse(bpm): 52 Weight(lbs): Blood Pressure(mmHg): 129/84 Body Mass Index(BMI): Temperature(F): Respiratory Rate 18 (breaths/min): Photos: [1:No Photos] [2:No Photos] [  N/A:N/A] Wound Location: [1:Right, Proximal, Midline Lower Leg] [2:Right Lower Leg - Midline, Distal] [N/A:N/A] Wounding Event: [1:Trauma] [2:Trauma] [N/A:N/A] Primary Etiology: [1:Trauma, Other] [2:Trauma, Other] [N/A:N/A] Comorbid History: [1:N/A] [2:Cataracts, Anemia, Lymphedema, Asthma, Chronic Obstructive Pulmonary Disease (COPD), Arrhythmia, Rheumatoid Arthritis] [N/A:N/A] Date Acquired: [1:10/02/2017] [2:10/02/2017] [N/A:N/A] Weeks of Treatment: [1:6] [2:6] [N/A:N/A] Wound Status: [1:Healed - Epithelialized] [2:Open] [N/A:N/A] Measurements L x W x D [1:0x0x0] [2:0.1x0.1x0.1] [N/A:N/A] (cm) Area (cm) : [1:0] [2:0.008] [N/A:N/A] Volume (cm) : [1:0] [2:0.001] [N/A:N/A] % Reduction in Area: [1:100.00%] [2:99.80%] [N/A:N/A] % Reduction in Volume: [1:100.00%] [2:99.70%] [N/A:N/A] Classification: [1:Partial Thickness] [2:Partial Thickness] [N/A:N/A] Exudate Amount: [1:N/A] [2:None Present] [N/A:N/A] Wound  Margin: [1:N/A] [2:Flat and Intact] [N/A:N/A] Granulation Amount: [1:N/A] [2:None Present (0%)] [N/A:N/A] Necrotic Amount: [1:N/A] [2:None Present (0%)] [N/A:N/A] Epithelialization: [1:N/A] [2:Large (67-100%)] [N/A:N/A] Periwound Skin Texture: [1:No Abnormalities Noted] [2:Excoriation: Yes Induration: Yes Scarring: Yes Callus: No Crepitus: No Rash: No] [N/A:N/A] Periwound Skin Moisture: [1:No Abnormalities Noted] [2:Maceration: No Dry/Scaly: No] [N/A:N/A] Periwound Skin Color: [1:No Abnormalities Noted] [2:Atrophie Blanche: No Cyanosis: No Ecchymosis: No] [N/A:N/A] Erythema: No Hemosiderin Staining: No Mottled: No Pallor: No Rubor: No Tenderness on Palpation: No No N/A Wound Preparation: N/A Ulcer Cleansing: N/A Rinsed/Irrigated with Saline Topical Anesthetic Applied: Other: lidocaine 4% Treatment Notes Electronic Signature(s) Signed: 11/18/2017 5:01:42 PM By: Montey Hora Entered By: Montey Hora on 11/18/2017 12:16:30 KALIFA, CADDEN ([id number]) -------------------------------------------------------------------------------- Multi-Disciplinary Care Plan Details Patient Name: Danielle Williamson Date of Service: 11/18/2017 11:00 AM Medical Record Number: [id number] Patient Account Number: [id number] Date of Birth/Sex: 09/24/1932 (84 y.o. Female) Treating RN: Montey Hora Primary Care Mariadelaluz Guggenheim: Harrel Lemon Other Clinician: Referring Delaine Canter: Harrel Lemon Treating Falan Hensler/Extender: Melburn Hake, HOYT Weeks in Treatment: 6 Active Inactive ` Orientation to the Wound Care Program Nursing Diagnoses: Knowledge deficit related to the wound healing center program Goals: Patient/caregiver will verbalize understanding of the [place] Program Date Initiated: 10/07/2017 Target Resolution Date: 11/07/2017 Goal Status: Active Interventions: Provide education on orientation to the wound center Notes: ` Wound/Skin Impairment Nursing Diagnoses: Impaired tissue  integrity Knowledge deficit related to ulceration/compromised skin integrity Goals: Patient/caregiver will verbalize understanding of skin care regimen Date Initiated: 10/07/2017 Target Resolution Date: 11/07/2017 Goal Status: Active Ulcer/skin breakdown will have a volume reduction of 30% by week 4 Date Initiated: 10/07/2017 Target Resolution Date: 11/07/2017 Goal Status: Active Interventions: Assess patient/caregiver ability to obtain necessary supplies Assess ulceration(s) every visit Provide education on ulcer and skin care Treatment Activities: Skin care regimen initiated : 10/07/2017 Notes: Electronic Signature(s) Signed: 11/18/2017 5:01:42 PM By: Jennette Kettle, [name] ([id number]) Entered By: Montey Hora on 11/18/2017 12:16:24 Danielle Williamson ([id number]) -------------------------------------------------------------------------------- Pain Assessment Details Patient Name: Danielle Williamson Date of Service: 11/18/2017 11:00 AM Medical Record Number: [id number] Patient Account Number: [id number] Date of Birth/Sex: Jan 13, 1932 (84 y.o. Female) Treating RN: Montey Hora Primary Care Jazziel Fitzsimmons: Harrel Lemon Other Clinician: Referring Lakiya Cottam: Harrel Lemon Treating Galdino Hinchman/Extender: Melburn Hake, HOYT Weeks in Treatment: 6 Active Problems Location of Pain Severity and Description of Pain Patient Has Paino No Site Locations Pain Management and Medication Current Pain Management: Electronic Signature(s) Signed: 11/18/2017 5:01:42 PM By: Montey Hora Entered By: Montey Hora on 11/18/2017 11:32:20 Greenbaum, [name] ([id number]) -------------------------------------------------------------------------------- Patient/Caregiver Education Details Patient Name: Danielle Williamson Date of Service: 11/18/2017 11:00 AM Medical Record Number: [id number] Patient Account Number: [id number] Date of Birth/Gender: 06-28-1932 (84 y.o. Female) Treating RN: Montey Hora Primary  Care Physician: Harrel Lemon Other Clinician: Referring Physician: Harrel Lemon Treating Physician/Extender: Melburn Hake, HOYT Weeks in Treatment:  6 Education Assessment Education Provided To: Patient Education Topics Provided Venous: Handouts: Other: leg elevation Methods: Explain/Verbal Responses: State content correctly Electronic Signature(s) Signed: 11/18/2017 5:01:42 PM By: Montey Hora Entered By: Montey Hora on 11/18/2017 16:34:44 Mcmeans, [name] ([id number]) -------------------------------------------------------------------------------- Wound Assessment Details Patient Name: Danielle Williamson Date of Service: 11/18/2017 11:00 AM Medical Record Number: [id number] Patient Account Number: [id number] Date of Birth/Sex: [birth year] (84 y.o. Female) Treating RN: Montey Hora Primary Care Angelli Baruch: Harrel Lemon Other Clinician: Referring Nikitta Sobiech: Harrel Lemon Treating Tyeler Goedken/Extender: Melburn Hake, HOYT Weeks in Treatment: 6 Wound Status Wound Number: 1 Primary Etiology: Trauma, Other Wound Location: Right, Proximal, Midline Lower Leg Wound Status: Healed - Epithelialized Wounding Event: Trauma Date Acquired: 10/02/2017 Weeks Of Treatment: 6 Clustered Wound: No Photos Photo Uploaded By: Montey Hora on 11/18/2017 13:21:39 Wound Measurements Length: (cm) 0 % Width: (cm) 0 % Depth: (cm) 0 Area: (cm) 0 Volume: (cm) 0 Reduction in Area: 100% Reduction in Volume: 100% Wound Description Classification: Partial Thickness Periwound Skin Texture Texture Color No Abnormalities Noted: No No Abnormalities Noted: No Moisture No Abnormalities Noted: No Electronic Signature(s) Signed: 11/18/2017 5:01:42 PM By: Montey Hora Entered By: Montey Hora on 11/18/2017 12:12:46 Huie, [name] ([id number]) -------------------------------------------------------------------------------- Wound Assessment Details Patient Name: Danielle Williamson Date of Service:  11/18/2017 11:00 AM Medical Record Number: [id number] Patient Account Number: [id number] Date of Birth/Sex: 1932-07-21 (84 y.o. Female) Treating RN: Montey Hora Primary Care Yolando Gillum: Harrel Lemon Other Clinician: Referring Annison Birchard: Harrel Lemon Treating Cleopatra Sardo/Extender: Melburn Hake, HOYT Weeks in Treatment: 6 Wound Status Wound Number: 2 Primary Trauma, Other Etiology: Wound Location: Right Lower Leg - Midline, Distal Wound Open Wounding Event: Trauma Status: Date Acquired: 10/02/2017 Comorbid Cataracts, Anemia, Lymphedema, Asthma, Weeks Of Treatment: 6 History: Chronic Obstructive Pulmonary Disease Clustered Wound: No (COPD), Arrhythmia, Rheumatoid Arthritis Photos Photo Uploaded By: Montey Hora on 11/18/2017 13:21:39 Wound Measurements Length: (cm) 0.1 Width: (cm) 0.1 Depth: (cm) 0.1 Area: (cm) 0.008 Volume: (cm) 0.001 % Reduction in Area: 99.8% % Reduction in Volume: 99.7% Epithelialization: Large (67-100%) Tunneling: No Undermining: No Wound Description Classification: Partial Thickness Foul O Wound Margin: Flat and Intact Slough Exudate Amount: None Present dor After Cleansing: No /Fibrino Yes Wound Bed Granulation Amount: None Present (0%) Exposed Structure Necrotic Amount: None Present (0%) Fat Layer (Subcutaneous Tissue) Exposed: Yes Periwound Skin Texture Texture Color No Abnormalities Noted: No No Abnormalities Noted: No Callus: No Atrophie Blanche: No Crepitus: No Cyanosis: No Excoriation: Yes Ecchymosis: No Induration: Yes Erythema: No Rash: No Hemosiderin Staining: No Covey, Myca H. ([id number]) Scarring: Yes Mottled: No Pallor: No Moisture Rubor: No No Abnormalities Noted: No Dry / Scaly: No Maceration: No Wound Preparation Ulcer Cleansing: Rinsed/Irrigated with Saline Topical Anesthetic Applied: Other: lidocaine 4%, Treatment Notes Wound #2 (Right, Distal, Midline Lower Leg) 1. Cleansed with: Clean wound with  Normal Saline 4. Dressing Applied: Mepitel 7. Secured with 3 Layer Compression System - Right Lower Extremity Electronic Signature(s) Signed: 11/18/2017 5:01:42 PM By: Montey Hora Entered By: Montey Hora on 11/18/2017 12:16:17 Robar, [name] ([id number]) -------------------------------------------------------------------------------- Vitals Details Patient Name: Danielle Williamson Date of Service: 11/18/2017 11:00 AM Medical Record Number: [id number] Patient Account Number: [id number] Date of Birth/Sex: 1932/08/30 (84 y.o. Female) Treating RN: Montey Hora Primary Care Milanni Ayub: Harrel Lemon Other Clinician: Referring Keyatta Tolles: Harrel Lemon Treating Egan Berkheimer/Extender: Melburn Hake, HOYT Weeks in Treatment: 6 Vital Signs Time Taken: 11:33 Pulse (bpm): 69 Height (in): 66 Respiratory Rate (breaths/min): 18 Blood Pressure (mmHg): 129/84 Reference Range: 80 - 120 mg / dl  Electronic Signature(s) Signed: 11/18/2017 5:01:42 PM By: Montey Hora Entered By: Montey Hora on 11/18/2017 11:33:17

## 2017-11-19 NOTE — Progress Notes (Addendum)
PEARLENA, OW ([id number]) Visit Report for 11/18/2017 Chief Complaint Document Details Patient Name: MCKINNLEY, COTTIER Date of Service: 11/18/2017 11:00 AM Medical Record Number: [id number] Patient Account Number: [id number] Date of Birth/Sex: 1932/05/01 (84 y.o. Female) Treating RN: Montey Hora Primary Care Provider: Harrel Lemon Other Clinician: Referring Provider: Harrel Lemon Treating Provider/Extender: Melburn Hake, Tyiesha Brackney Weeks in Treatment: 6 Information Obtained from: Patient Chief Complaint Patient seen for complaints of Non-Healing Wound to the right lower extremity which was caused by an accidental fall Electronic Signature(s) Signed: 11/18/2017 1:29:11 PM By: Worthy Keeler PA-C Entered By: Worthy Keeler on 11/18/2017 10:54:51 Termine, [name] ([id number]) -------------------------------------------------------------------------------- HPI Details Patient Name: [name] Date of Service: 11/18/2017 11:00 AM Medical Record Number: [id number] Patient Account Number: [id number] Date of Birth/Sex: 1932-08-08 (84 y.o. Female) Treating RN: Montey Hora Primary Care Provider: Harrel Lemon Other Clinician: Referring Provider: Harrel Lemon Treating Provider/Extender: Melburn Hake, Keyundra Fant Weeks in Treatment: 6 History of Present Illness HPI Description: 84 year old patient has been seen recently by Dr. Hortencia Pilar of  vein and vascular for leg pain and swelling associated with venous ulceration. She had been seen and treated with Unna boot therapy and the swelling got worse and there was discoloration of the leg. he recommended continuing compression stockings and elevation and exercise and he would not do any surgery at the present time. She should also continue with a lymphedema pump. he also noted evidence of arthrosclerosis of the lower extremity with claudication and noninvasive studies do not suggest clinically significant change. Noninvasive studies,  angiogram or surgery at this time. He recommended continuing walking and more formal exercise program. The arterial studies done in September of this year showed a normal ABI bilaterally with the right being 1.03 and the left being 1.14 with triphasic flow bilaterally. She had a DVT study done in August 2018 but I do not see any note off of venous reflux study being done. past medical history significant for COPD, depression, hypertension, panic attacks, peripheral vascular disease, status post cholecystectomy, colonoscopy, hysterectomy and right fourth toe surgery.she quit smoking about 4 years ago and does not drink alcohol. Most recently she was seen on 10/02/2017 after a fall and a skin tear on her right leg with no evidence of fracture.the skin tear was approximated with Steri-Strips and a Xeroform gauze dressing and a tetanus toxoid is up-to-date. 10/21/17 on evaluation today patient is seen for follow-up concerning her right lower extremity skin tears. She overall does have a dull pain to the area which is a much more sharp/burning discomfort with cleansing. She has extremely fragile skin and is prone to injury of this nature she states. The good news is the more proximal ulceration seems to be doing much better at this point and in fact is healing quite nicely. The inferior ulceration did have a lot of loose skin which was cleansed from around the wound. With that being said there is some Slough noted in the central aspect of the wound as well although due to the pain which is more significant with cleansing I was unable to perform any debridement today. No fevers, chills, nausea, or vomiting noted at this time. 11/04/17 on evaluation today patient's wounds appear to be doing much better. Her proximal wound appears likely to be completely closed although there was one small area I am still wondering if there was an opening this definitely does not appear to be significant. Subsequently them  greatly in regard to the inferior wound there  is some Slough noted in the inferior aspect of the wound bed that does need and required debridement. She still continues to be somewhat tender to palpation and even light touch. 11/18/17 On evaluation today patient's ulcers appear to be completely healed at this point. There may be just a slight opening in regard to the inferior ulceration although this is very pinpoint at this point. There does not appear to be any evidence of infection which is excellent news. She is having no discomfort. Overall I'm very pleased with how this has progressed in the short amount of time since we have been seeing her. She likewise is very pleased. Electronic Signature(s) Signed: 11/18/2017 1:29:11 PM By: Worthy Keeler PA-C Entered By: Worthy Keeler on 11/18/2017 13:22:57 ADREONNA, YONTZ ([id number]) -------------------------------------------------------------------------------- Physical Exam Details Patient Name: [name] Date of Service: 11/18/2017 11:00 AM Medical Record Number: [id number] Patient Account Number: [id number] Date of Birth/Sex: 08-09-1932 (84 y.o. Female) Treating RN: Montey Hora Primary Care Provider: Harrel Lemon Other Clinician: Referring Provider: Harrel Lemon Treating Provider/Extender: Melburn Hake, Antonia Culbertson Weeks in Treatment: 6 Constitutional Well-nourished and well-hydrated in no acute distress. Respiratory normal breathing without difficulty. clear to auscultation bilaterally. Cardiovascular regular rate and rhythm with normal S1, S2. 1+ pitting edema of the bilateral lower extremities. Psychiatric this patient is able to make decisions and demonstrates good insight into disease process. Alert and Oriented x 3. pleasant and cooperative. Notes Patient's wound in regard to the inferior wound has one small area of pinpoint opening possibly that is still not completely close. However for the most part this does appear to  mostly be close. Obviously this is excellent compared to last week's evaluation. I'm going to defer any debridement as this was not necessary today obviously and we are going to see her in one more week at which point in time I fully expect this to be completely close without any complication. Electronic Signature(s) Signed: 11/18/2017 1:29:11 PM By: Worthy Keeler PA-C Entered By: Worthy Keeler on 11/18/2017 13:23:43 CHINITA, SCHIMPF ([id number]) -------------------------------------------------------------------------------- Physician Orders Details Patient Name: [name] Date of Service: 11/18/2017 11:00 AM Medical Record Number: [id number] Patient Account Number: [id number] Date of Birth/Sex: [birth year] (84 y.o. Female) Treating RN: Montey Hora Primary Care Provider: Harrel Lemon Other Clinician: Referring Provider: Harrel Lemon Treating Provider/Extender: Melburn Hake, Jayan Raymundo Weeks in Treatment: 6 Verbal / Phone Orders: No Diagnosis Coding ICD-10 Coding Code Description [phone] Laceration without foreign body, right lower leg, initial encounter L97.212 Non-pressure chronic ulcer of right calf with fat layer exposed I89.0 Lymphedema, not elsewhere classified Wound Cleansing Wound #2 Right,Distal,Midline Lower Leg o Clean wound with Normal Saline. Anesthetic (add to Medication List) Wound #2 Right,Distal,Midline Lower Leg o Topical Lidocaine 4% cream applied to wound bed prior to debridement (In Clinic Only). Skin Barriers/Peri-Wound Care Wound #2 Right,Distal,Midline Lower Leg o Moisturizing lotion - leg and foot Primary Wound Dressing Wound #2 Right,Distal,Midline Lower Leg o Mepitel One Contact layer Dressing Change Frequency Wound #2 Right,Distal,Midline Lower Leg o Change dressing every week Follow-up Appointments o Return Appointment in 1 week. Edema Control o 3 Layer Compression System - Right Lower Extremity Home Health Wound #2  Right,Distal,Midline Lower Leg o [place] Nurse may visit PRN to address patientos wound care needs. o FACE TO FACE ENCOUNTER: MEDICARE and MEDICAID PATIENTS: I certify that this patient is under my care and that I had a face-to-face encounter that meets the physician face-to-face encounter requirements  with this patient on this date. The encounter with the patient was in whole or in part for the following MEDICAL CONDITION: (primary reason for [place]) MEDICAL NECESSITY: I certify, that based on my findings, NURSING services are a medically necessary home health service. HOME BOUND STATUS: I certify that my clinical findings support that this patient is homebound (i.e., Due to illness or injury, pt requires aid of supportive devices such as crutches, cane, wheelchairs, walkers, the use of special transportation or the Lince, Mouna H. ([id number]) assistance of another person to leave their place of residence. There is a normal inability to leave the home and doing so requires considerable and taxing effort. Other absences are for medical reasons / religious services and are infrequent or of short duration when for other reasons). o If current dressing causes regression in wound condition, may D/C ordered dressing product/s and apply Normal Saline Moist Dressing daily until next [place] / Other MD appointment. [place] of regression in wound condition at [phone]. o Please direct any NON-WOUND related issues/requests for orders to patient's Primary Care Physician Electronic Signature(s) Signed: 11/18/2017 1:29:11 PM By: Worthy Keeler PA-C Signed: 11/18/2017 5:01:42 PM By: Montey Hora Entered By: Montey Hora on 11/18/2017 12:17:12 Guiney, [name] ([id number]) -------------------------------------------------------------------------------- Problem List Details Patient Name: [name] Date of  Service: 11/18/2017 11:00 AM Medical Record Number: [id number] Patient Account Number: [id number] Date of Birth/Sex: Dec 16, 1931 (84 y.o. Female) Treating RN: Montey Hora Primary Care Provider: Harrel Lemon Other Clinician: Referring Provider: Harrel Lemon Treating Provider/Extender: Melburn Hake, Shirely Toren Weeks in Treatment: 6 Active Problems ICD-10 Encounter Code Description Active Date Diagnosis S81.811A Laceration without foreign body, right lower leg, initial encounter 10/07/2017 Yes L97.212 Non-pressure chronic ulcer of right calf with fat layer exposed 10/07/2017 Yes I89.0 Lymphedema, not elsewhere classified 10/07/2017 Yes Inactive Problems Resolved Problems Electronic Signature(s) Signed: 11/18/2017 1:29:11 PM By: Worthy Keeler PA-C Entered By: Worthy Keeler on 11/18/2017 10:54:44 Curci, [name] ([id number]) -------------------------------------------------------------------------------- Progress Note Details Patient Name: [name] Date of Service: 11/18/2017 11:00 AM Medical Record Number: [id number] Patient Account Number: [id number] Date of Birth/Sex: 10/23/1931 (84 y.o. Female) Treating RN: Montey Hora Primary Care Provider: Harrel Lemon Other Clinician: Referring Provider: Harrel Lemon Treating Provider/Extender: Melburn Hake, Junell Cullifer Weeks in Treatment: 6 Subjective Chief Complaint Information obtained from Patient Patient seen for complaints of Non-Healing Wound to the right lower extremity which was caused by an accidental fall History of Present Illness (HPI) 84 year old patient has been seen recently by Dr. Hortencia Pilar of [facility] for leg pain and swelling associated with venous ulceration. She had been seen and treated with Unna boot therapy and the swelling got worse and there was discoloration of the leg. he recommended continuing compression stockings and elevation and exercise and he would not do any surgery at the present  time. She should also continue with a lymphedema pump. he also noted evidence of arthrosclerosis of the lower extremity with claudication and noninvasive studies do not suggest clinically significant change. Noninvasive studies, angiogram or surgery at this time. He recommended continuing walking and more formal exercise program. The arterial studies done in September of this year showed a normal ABI bilaterally with the right being 1.03 and the left being 1.14 with triphasic flow bilaterally. She had a DVT study done in August 2018 but I do not see any note off of venous reflux study being done. past medical history significant for COPD, depression, hypertension,  panic attacks, peripheral vascular disease, status post cholecystectomy, colonoscopy, hysterectomy and right fourth toe surgery.she quit smoking about 4 years ago and does not drink alcohol. Most recently she was seen on 10/02/2017 after a fall and a skin tear on her right leg with no evidence of fracture.the skin tear was approximated with Steri-Strips and a Xeroform gauze dressing and a tetanus toxoid is up-to-date. 10/21/17 on evaluation today patient is seen for follow-up concerning her right lower extremity skin tears. She overall does have a dull pain to the area which is a much more sharp/burning discomfort with cleansing. She has extremely fragile skin and is prone to injury of this nature she states. The good news is the more proximal ulceration seems to be doing much better at this point and in fact is healing quite nicely. The inferior ulceration did have a lot of loose skin which was cleansed from around the wound. With that being said there is some Slough noted in the central aspect of the wound as well although due to the pain which is more significant with cleansing I was unable to perform any debridement today. No fevers, chills, nausea, or vomiting noted at this time. 11/04/17 on evaluation today patient's wounds appear  to be doing much better. Her proximal wound appears likely to be completely closed although there was one small area I am still wondering if there was an opening this definitely does not appear to be significant. Subsequently them greatly in regard to the inferior wound there is some Slough noted in the inferior aspect of the wound bed that does need and required debridement. She still continues to be somewhat tender to palpation and even light touch. 11/18/17 On evaluation today patient's ulcers appear to be completely healed at this point. There may be just a slight opening in regard to the inferior ulceration although this is very pinpoint at this point. There does not appear to be any evidence of infection which is excellent news. She is having no discomfort. Overall I'm very pleased with how this has progressed in the short amount of time since we have been seeing her. She likewise is very pleased. Patient History Information obtained from Patient. Family History Cancer - Father, Heart Disease - Mother, Hypertension - Mother, Kidney Disease - Father, Stroke - Paternal SHUNTE, SENSENEY ([id number]) Thyroid Problems - Father, No family history of Diabetes, Lung Disease, Seizures, Tuberculosis. Social History Former smoker - 4 years ago, Marital Status - Widowed, Alcohol Use - Rarely, Drug Use - No History, Caffeine Use - Daily. Review of Systems (ROS) Constitutional Symptoms (General Health) Denies complaints or symptoms of Fever, Chills. Respiratory The patient has no complaints or symptoms. Cardiovascular Complains or has symptoms of LE edema. Psychiatric The patient has no complaints or symptoms. Objective Constitutional Well-nourished and well-hydrated in no acute distress. Vitals Time Taken: 11:33 AM, Height: 66 in, Pulse: 69 bpm, Respiratory Rate: 18 breaths/min, Blood Pressure: 129/84 mmHg. Respiratory normal breathing without difficulty. clear to auscultation  bilaterally. Cardiovascular regular rate and rhythm with normal S1, S2. 1+ pitting edema of the bilateral lower extremities. Psychiatric this patient is able to make decisions and demonstrates good insight into disease process. Alert and Oriented x 3. pleasant and cooperative. General Notes: Patient's wound in regard to the inferior wound has one small area of pinpoint opening possibly that is still not completely close. However for the most part this does appear to mostly be close. Obviously this is excellent compared to last week's evaluation.  I'm going to defer any debridement as this was not necessary today obviously and we are going to see her in one more week at which point in time I fully expect this to be completely close without any complication. Integumentary (Hair, Skin) Wound #1 status is Healed - Epithelialized. Original cause of wound was Trauma. The wound is located on the Right,Proximal,Midline Lower Leg. The wound measures 0cm length x 0cm width x 0cm depth; 0cm^2 area and 0cm^3 volume. Wound #2 status is Open. Original cause of wound was Trauma. The wound is located on the Right,Distal,Midline Lower Leg. The wound measures 0.1cm length x 0.1cm width x 0.1cm depth; 0.008cm^2 area and 0.001cm^3 volume. There is Fat Layer (Subcutaneous Tissue) Exposed exposed. There is no tunneling or undermining noted. There is a none present amount of drainage noted. The wound margin is flat and intact. There is no granulation within the wound bed. There is no necrotic tissue within the wound bed. The periwound skin appearance exhibited: Excoriation, Induration, Scarring. The periwound skin appearance did not exhibit: Callus, Crepitus, Rash, Dry/Scaly, Maceration, Atrophie Blanche, Cyanosis, Ecchymosis, Knipfer, Rennae H. ([id number]) Hemosiderin Staining, Mottled, Pallor, Rubor, Erythema. Assessment Active Problems ICD-10 S81.811A - Laceration without foreign body, right lower leg, initial  encounter L97.212 - Non-pressure chronic ulcer of right calf with fat layer exposed I89.0 - Lymphedema, not elsewhere classified Procedures Wound #2 Pre-procedure diagnosis of Wound #2 is a Trauma, Other located on the Right,Distal,Midline Lower Leg . There was a Three Layer Compression Therapy Procedure by Montey Hora, RN. Post procedure Diagnosis Wound #2: Same as Pre-Procedure Plan Wound Cleansing: Wound #2 Right,Distal,Midline Lower Leg: Clean wound with Normal Saline. Anesthetic (add to Medication List): Wound #2 Right,Distal,Midline Lower Leg: Topical Lidocaine 4% cream applied to wound bed prior to debridement (In Clinic Only). Skin Barriers/Peri-Wound Care: Wound #2 Right,Distal,Midline Lower Leg: Moisturizing lotion - leg and foot Primary Wound Dressing: Wound #2 Right,Distal,Midline Lower Leg: Mepitel One Contact layer Dressing Change Frequency: Wound #2 Right,Distal,Midline Lower Leg: Change dressing every week Follow-up Appointments: Return Appointment in 1 week. Edema Control: 3 Layer Compression System - Right Lower Extremity Home Health: Wound #2 Right,Distal,Midline Lower Leg: [place] Nurse may visit PRN to address patient s wound care needs. FACE TO FACE ENCOUNTER: MEDICARE and MEDICAID PATIENTS: I certify that this patient is under my care and that I had a face-to-face encounter that meets the physician face-to-face encounter requirements with this patient on this date. The [place], Roberto H. ([id number]) encounter with the patient was in whole or in part for the following MEDICAL CONDITION: (primary reason for [place]) MEDICAL NECESSITY: I certify, that based on my findings, NURSING services are a medically necessary home health service. HOME BOUND STATUS: I certify that my clinical findings support that this patient is homebound (i.e., Due to illness or injury, pt requires aid of supportive devices such as crutches,  cane, wheelchairs, walkers, the use of special transportation or the assistance of another person to leave their place of residence. There is a normal inability to leave the home and doing so requires considerable and taxing effort. Other absences are for medical reasons / religious services and are infrequent or of short duration when for other reasons). If current dressing causes regression in wound condition, may D/C ordered dressing product/s and apply Normal Saline Moist Dressing daily until next [place] / Other MD appointment. [place] of regression in wound condition at [phone]. Please direct any NON-WOUND  related issues/requests for orders to patient's Primary Care Physician In the meantime we will continue with the above wound care orders for the next week which includes the [facility] towel along with the compression. Hopefully that will keep the fluid out of her leg until we can be sure that the inferior wound has completely healed. Patient is in agreement with the plan. I fully expect her discharge next week having been fully healed. Please see above for specific wound care orders. We will see patient for re-evaluation in 1 week(s) here in the clinic. If anything worsens or changes patient will contact our office for additional recommendations. Electronic Signature(s) Signed: 12/10/2017 10:42:09 AM By: Worthy Keeler PA-C Previous Signature: 11/18/2017 1:29:11 PM Version By: Worthy Keeler PA-C Entered By: Worthy Keeler on 12/10/2017 08:10:08 AFTIN, LYE ([id number]) -------------------------------------------------------------------------------- ROS/PFSH Details Patient Name: [name] Date of Service: 11/18/2017 11:00 AM Medical Record Number: [id number] Patient Account Number: [id number] Date of Birth/Sex: [birth year] (84 y.o. Female) Treating RN: Montey Hora Primary Care Provider: Harrel Lemon Other Clinician: Referring Provider:  Harrel Lemon Treating Provider/Extender: Melburn Hake, Marguita Venning Weeks in Treatment: 6 Information Obtained From Patient Wound History Do you currently have one or more open woundso Yes How many open wounds do you currently haveo 3 Approximately how long have you had your woundso 6 days Has your wound(s) ever healed and then re-openedo No Have you had any lab work done in the past montho No Have you tested positive for an antibiotic resistant organism (MRSA, VRE)o No Have you tested positive for osteomyelitis (bone infection)o No Have you had any tests for circulation on your legso Yes Who ordered the testo Dr. Arelia Sneddon Where was the test doneo few months ago Have you had other problems associated with your woundso Swelling Constitutional Symptoms (General Health) Complaints and Symptoms: Negative for: Fever; Chills Cardiovascular Complaints and Symptoms: Positive for: LE edema Medical History: Positive for: Arrhythmia Negative for: Angina; Congestive Heart Failure; Coronary Artery Disease; Deep Vein Thrombosis; Hypertension; Hypotension; Myocardial Infarction; Peripheral Arterial Disease; Peripheral Venous Disease; Phlebitis; Vasculitis Eyes Medical History: Positive for: Cataracts - surgery in past Negative for: Glaucoma; Optic Neuritis Ear/Nose/Mouth/Throat Medical History: Negative for: Chronic sinus problems/congestion; Middle ear problems Hematologic/Lymphatic Medical History: Positive for: Anemia; Lymphedema Negative for: Hemophilia; Human Immunodeficiency Virus; Sickle Cell Disease Respiratory Vanliew, Ajee H. ([id number]) Complaints and Symptoms: No Complaints or Symptoms Medical History: Positive for: Asthma; Chronic Obstructive Pulmonary Disease (COPD) Negative for: Aspiration; Pneumothorax; Sleep Apnea; Tuberculosis Gastrointestinal Medical History: Negative for: Cirrhosis ; Colitis; Crohnos; Hepatitis A; Hepatitis B; Hepatitis C Endocrine Medical  History: Negative for: Type I Diabetes; Type II Diabetes Genitourinary Medical History: Negative for: End Stage Renal Disease Immunological Medical History: Negative for: Lupus Erythematosus; Raynaudos; Scleroderma Integumentary (Skin) Medical History: Negative for: History of Burn; History of pressure wounds Musculoskeletal Medical History: Positive for: Rheumatoid Arthritis Negative for: Gout; Osteoarthritis; Osteomyelitis Neurologic Medical History: Negative for: Dementia; Neuropathy; Quadriplegia; Paraplegia; Seizure Disorder Oncologic Medical History: Negative for: Received Chemotherapy; Received Radiation Psychiatric Complaints and Symptoms: No Complaints or Symptoms Medical History: Negative for: Anorexia/bulimia; Confinement Anxiety HBO Extended History Items Eyes: Cataracts Rinke, Valma H. ([id number]) Immunizations Pneumococcal Vaccine: Received Pneumococcal Vaccination: Yes Implantable Devices Family and Social History Cancer: Yes - Father; Diabetes: No; Heart Disease: Yes - Mother; Hypertension: Yes - Mother; Kidney Disease: Yes - Father; Lung Disease: No; Seizures: No; Stroke: Yes - Paternal Grandparents; Thyroid Problems: Yes - Father; Tuberculosis: No; Former smoker - 4 years ago; Marital Status -  Widowed; Alcohol Use: Rarely; Drug Use: No History; Caffeine Use: Daily; Financial Concerns: No; Food, Clothing or Shelter Needs: No; Support System Lacking: No; Transportation Concerns: No; Advanced Directives: Yes (Not Provided); Patient does not want information on Advanced Directives; Do not resuscitate: Yes (Not Provided); Living Will: Yes (Not Provided); Medical Power of Attorney: Yes (Not Provided) Physician Affirmation I have reviewed and agree with the above information. Electronic Signature(s) Signed: 11/18/2017 1:29:11 PM By: Worthy Keeler PA-C Signed: 11/18/2017 5:01:42 PM By: Montey Hora Entered By: Worthy Keeler on 11/18/2017 13:23:20 Rottenberg,  [name] ([id number]) -------------------------------------------------------------------------------- SuperBill Details Patient Name: [name] Date of Service: 11/18/2017 Medical Record Number: [id number] Patient Account Number: [id number] Date of Birth/Sex: 10-30-1931 (84 y.o. Female) Treating RN: Montey Hora Primary Care Provider: Harrel Lemon Other Clinician: Referring Provider: Harrel Lemon Treating Provider/Extender: Melburn Hake, Irine Heminger Weeks in Treatment: 6 Diagnosis Coding ICD-10 Codes Code Description [phone] Laceration without foreign body, right lower leg, initial encounter L97.212 Non-pressure chronic ulcer of right calf with fat layer exposed I89.0 Lymphedema, not elsewhere classified Facility Procedures CPT4 Code Description: [id number] (Facility Use Only) [phone] - [place] RT LEG Modifier: Quantity: 1 Physician Procedures CPT4 Code: 4944967 Description: 59163 - WC PHYS LEVEL 3 - EST PT ICD-10 Diagnosis Description S81.811A Laceration without foreign body, right lower leg, initial en L97.212 Non-pressure chronic ulcer of right calf with fat layer expo I89.0 Lymphedema, not elsewhere  classified Modifier: counter sed Quantity: 1 Electronic Signature(s) Signed: 11/18/2017 3:53:59 PM By: Montey Hora Signed: 11/19/2017 8:22:01 AM By: Worthy Keeler PA-C Previous Signature: 11/18/2017 1:29:11 PM Version By: Worthy Keeler PA-C Entered By: Montey Hora on 11/18/2017 15:53:58

## 2017-11-26 ENCOUNTER — Encounter: Payer: Medicare Other | Attending: Physician Assistant | Admitting: Physician Assistant

## 2017-11-26 DIAGNOSIS — Z841 Family history of disorders of kidney and ureter: Secondary | ICD-10-CM | POA: Insufficient documentation

## 2017-11-26 DIAGNOSIS — M069 Rheumatoid arthritis, unspecified: Secondary | ICD-10-CM | POA: Diagnosis not present

## 2017-11-26 DIAGNOSIS — J449 Chronic obstructive pulmonary disease, unspecified: Secondary | ICD-10-CM | POA: Diagnosis not present

## 2017-11-26 DIAGNOSIS — L97212 Non-pressure chronic ulcer of right calf with fat layer exposed: Secondary | ICD-10-CM | POA: Insufficient documentation

## 2017-11-26 DIAGNOSIS — I509 Heart failure, unspecified: Secondary | ICD-10-CM | POA: Diagnosis not present

## 2017-11-26 DIAGNOSIS — I499 Cardiac arrhythmia, unspecified: Secondary | ICD-10-CM | POA: Insufficient documentation

## 2017-11-26 DIAGNOSIS — Z90710 Acquired absence of both cervix and uterus: Secondary | ICD-10-CM | POA: Diagnosis not present

## 2017-11-26 DIAGNOSIS — F329 Major depressive disorder, single episode, unspecified: Secondary | ICD-10-CM | POA: Diagnosis not present

## 2017-11-26 DIAGNOSIS — I110 Hypertensive heart disease with heart failure: Secondary | ICD-10-CM | POA: Diagnosis not present

## 2017-11-26 DIAGNOSIS — I890 Lymphedema, not elsewhere classified: Secondary | ICD-10-CM | POA: Diagnosis not present

## 2017-11-26 DIAGNOSIS — Z87891 Personal history of nicotine dependence: Secondary | ICD-10-CM | POA: Insufficient documentation

## 2017-11-26 DIAGNOSIS — Z8249 Family history of ischemic heart disease and other diseases of the circulatory system: Secondary | ICD-10-CM | POA: Insufficient documentation

## 2017-11-26 DIAGNOSIS — I252 Old myocardial infarction: Secondary | ICD-10-CM | POA: Insufficient documentation

## 2017-11-26 DIAGNOSIS — F410 Panic disorder [episodic paroxysmal anxiety] without agoraphobia: Secondary | ICD-10-CM | POA: Diagnosis not present

## 2017-11-26 DIAGNOSIS — I739 Peripheral vascular disease, unspecified: Secondary | ICD-10-CM | POA: Diagnosis not present

## 2017-11-26 DIAGNOSIS — Z823 Family history of stroke: Secondary | ICD-10-CM | POA: Diagnosis not present

## 2017-11-26 DIAGNOSIS — Z9049 Acquired absence of other specified parts of digestive tract: Secondary | ICD-10-CM | POA: Diagnosis not present

## 2017-11-27 NOTE — Progress Notes (Addendum)
VEGA, STARE ([id number]) Visit Report for 11/26/2017 Arrival Information Details Patient Name: Danielle Williamson, Danielle Williamson Date of Service: 11/26/2017 2:45 PM Medical Record Number: [id number] Patient Account Number: [id number] Date of Birth/Sex: 1932/03/01 (85 y.o. Female) Treating RN: Roger Shelter Primary Care Chiquetta Langner: Harrel Lemon Other Clinician: Referring Laneice Meneely: Harrel Lemon Treating Macel Yearsley/Extender: Melburn Hake, HOYT Weeks in Treatment: 7 Visit Information History Since Last Visit All ordered tests and consults were completed: No Patient Arrived: Ambulatory Added or deleted any medications: No Arrival Time: 14:56 Any new allergies or adverse reactions: No Accompanied By: friend Had a fall or experienced change in No Transfer Assistance: None activities of daily living that may affect Patient Identification Verified: Yes risk of falls: Secondary Verification Process Completed: Yes Signs or symptoms of abuse/neglect since last visito No Patient Requires Transmission-Based No Hospitalized since last visit: No Precautions: Pain Present Now: No Patient Has Alerts: Yes Patient Alerts: right ABI- 1.21 Left ABI- 1.14 Electronic Signature(s) Signed: 11/26/2017 4:48:53 PM By: Roger Shelter Entered By: Roger Shelter on 11/26/2017 15:05:56 Leggette, [name] ([id number]) -------------------------------------------------------------------------------- Clinic Level of Care Assessment Details Patient Name: Danielle Williamson Date of Service: 11/26/2017 2:45 PM Medical Record Number: [id number] Patient Account Number: [id number] Date of Birth/Sex: 06-21-1932 (84 y.o. Female) Treating RN: Roger Shelter Primary Care Elias Bordner: Harrel Lemon Other Clinician: Referring Caridad Silveira: Harrel Lemon Treating Susana Gripp/Extender: Melburn Hake, HOYT Weeks in Treatment: 7 Clinic Level of Care Assessment Items TOOL 4 Quantity Score []  - Use when only an EandM is performed on FOLLOW-UP visit  0 ASSESSMENTS - Nursing Assessment / Reassessment []  - Reassessment of Co-morbidities (includes updates in patient status) 0 X- 1 5 Reassessment of Adherence to Treatment Plan ASSESSMENTS - Wound and Skin Assessment / Reassessment []  - Simple Wound Assessment / Reassessment - one wound 0 []  - 0 Complex Wound Assessment / Reassessment - multiple wounds X- 1 10 Dermatologic / Skin Assessment (not related to wound area) ASSESSMENTS - Focused Assessment []  - Circumferential Edema Measurements - multi extremities 0 []  - 0 Nutritional Assessment / Counseling / Intervention []  - 0 Lower Extremity Assessment (monofilament, tuning fork, pulses) []  - 0 Peripheral Arterial Disease Assessment (using hand held doppler) ASSESSMENTS - Ostomy and/or Continence Assessment and Care []  - Incontinence Assessment and Management 0 []  - 0 Ostomy Care Assessment and Management (repouching, etc.) PROCESS - Coordination of Care X - Simple Patient / Family Education for ongoing care 1 15 []  - 0 Complex (extensive) Patient / Family Education for ongoing care []  - 0 Staff obtains Programmer, systems, Records, Test Results / Process Orders []  - 0 Staff telephones HHA, Nursing Homes / Clarify orders / etc []  - 0 Routine Transfer to another Facility (non-emergent condition) []  - 0 Routine Hospital Admission (non-emergent condition) []  - 0 New Admissions / Biomedical engineer / Ordering NPWT, Apligraf, etc. []  - 0 Emergency Hospital Admission (emergent condition) X- 1 10 Simple Discharge Coordination Flaugher, Naureen H. ([id number]) []  - 0 Complex (extensive) Discharge Coordination PROCESS - Special Needs []  - Pediatric / Minor Patient Management 0 []  - 0 Isolation Patient Management []  - 0 Hearing / Language / Visual special needs []  - 0 Assessment of Community assistance (transportation, D/C planning, etc.) []  - 0 Additional assistance / Altered mentation []  - 0 Support Surface(s) Assessment (bed,  cushion, seat, etc.) INTERVENTIONS - Wound Cleansing / Measurement []  - Simple Wound Cleansing - one wound 0 []  - 0 Complex Wound Cleansing - multiple wounds []  - 0 Wound Imaging (photographs - any number  of wounds) []  - 0 Wound Tracing (instead of photographs) []  - 0 Simple Wound Measurement - one wound []  - 0 Complex Wound Measurement - multiple wounds INTERVENTIONS - Wound Dressings []  - Small Wound Dressing one or multiple wounds 0 []  - 0 Medium Wound Dressing one or multiple wounds []  - 0 Large Wound Dressing one or multiple wounds []  - 0 Application of Medications - topical []  - 0 Application of Medications - injection INTERVENTIONS - Miscellaneous []  - External ear exam 0 []  - 0 Specimen Collection (cultures, biopsies, blood, body fluids, etc.) []  - 0 Specimen(s) / Culture(s) sent or taken to Lab for analysis []  - 0 Patient Transfer (multiple staff / Civil Service fast streamer / Similar devices) []  - 0 Simple Staple / Suture removal (25 or less) []  - 0 Complex Staple / Suture removal (26 or more) []  - 0 Hypo / Hyperglycemic Management (close monitor of Blood Glucose) []  - 0 Ankle / Brachial Index (ABI) - do not check if billed separately X- 1 5 Vital Signs Guimont, Taniah H. ([id number]) Has the patient been seen at the hospital within the last three years: Yes Total Score: 45 Level Of Care: New/Established - Level 2 Electronic Signature(s) Signed: 11/26/2017 4:48:53 PM By: Roger Shelter Entered By: Roger Shelter on 11/26/2017 15:51:12 Mcmartin, [name] ([id number]) -------------------------------------------------------------------------------- Encounter Discharge Information Details Patient Name: Danielle Williamson Date of Service: 11/26/2017 2:45 PM Medical Record Number: [id number] Patient Account Number: [id number] Date of Birth/Sex: [birth year] (85 y.o. Female) Treating RN: Roger Shelter Primary Care Bowen Kia: Harrel Lemon Other Clinician: Referring Kianni Lheureux:  Harrel Lemon Treating [place] Want/Extender: Melburn Hake, HOYT Weeks in Treatment: 7 Encounter Discharge Information Items Discharge Pain Level: 0 Discharge Condition: Stable Ambulatory Status: Ambulatory Discharge Destination: Home Transportation: Private Auto Accompanied By: friend Schedule Follow-up Appointment: Yes Medication Reconciliation completed and No provided to Patient/Care Yossi Hinchman: Patient Clinical Summary of Care: Declined Electronic Signature(s) Signed: 11/26/2017 4:48:53 PM By: Roger Shelter Entered By: Roger Shelter on 11/26/2017 15:53:11 Hartje, [name] ([id number]) -------------------------------------------------------------------------------- Lower Extremity Assessment Details Patient Name: Danielle Williamson Date of Service: 11/26/2017 2:45 PM Medical Record Number: [id number] Patient Account Number: [id number] Date of Birth/Sex: [birth year] (84 y.o. Female) Treating RN: Roger Shelter Primary Care Sahar Ryback: Harrel Lemon Other Clinician: Referring Lyanne Kates: Harrel Lemon Treating Akasia Ahmad/Extender: Melburn Hake, HOYT Weeks in Treatment: 7 Edema Assessment Assessed: [Left: No] [Right: No] Edema: [Left: N] [Right: o] Vascular Assessment Claudication: Claudication Assessment [Right:None] Pulses: Dorsalis Pedis Palpable: [Right:Yes] Posterior Tibial Extremity colors, hair growth, and conditions: Extremity Color: [Right:Hyperpigmented] Hair Growth on Extremity: [Right:Yes] Temperature of Extremity: [Right:Cool] Capillary Refill: [Right:< 3 seconds] Toe Nail Assessment Left: Right: Thick: Yes Discolored: Yes Deformed: No Improper Length and Hygiene: No Electronic Signature(s) Signed: 11/26/2017 4:48:53 PM By: Roger Shelter Entered By: Roger Shelter on 11/26/2017 15:22:01 Leidy, [name] ([id number]) -------------------------------------------------------------------------------- Multi Wound Chart Details Patient Name: Danielle Williamson Date  of Service: 11/26/2017 2:45 PM Medical Record Number: [id number] Patient Account Number: [id number] Date of Birth/Sex: [birth year] (85 y.o. Female) Treating RN: Roger Shelter Primary Care Nayellie Sanseverino: Harrel Lemon Other Clinician: Referring Ludmila Ebarb: Harrel Lemon Treating Tristine Langi/Extender: Melburn Hake, HOYT Weeks in Treatment: 7 Vital Signs Height(in): 88 Pulse(bpm): 14 Weight(lbs): Blood Pressure(mmHg): 119/50 Body Mass Index(BMI): Temperature(F): 97.6 Respiratory Rate 18 (breaths/min): Photos: [2:No Photos] [N/A:N/A] Wound Location: [2:Right Lower Leg - Midline, Distal] [N/A:N/A] Wounding Event: [2:Trauma] [N/A:N/A] Primary Etiology: [2:Trauma, Other] [N/A:N/A] Comorbid History: [2:Cataracts, Anemia, Lymphedema, Asthma, Chronic Obstructive Pulmonary Disease (COPD), Arrhythmia, Rheumatoid Arthritis] [N/A:N/A] Date Acquired: [2:10/02/2017] [N/A:N/A] Weeks  of Treatment: [2:7] [N/A:N/A] Wound Status: [2:Open] [N/A:N/A] Measurements L x W x D [2:0x0x0] [N/A:N/A] (cm) Area (cm) : [2:0] [N/A:N/A] Volume (cm) : [2:0] [N/A:N/A] % Reduction in Area: [2:100.00%] [N/A:N/A] % Reduction in Volume: [2:100.00%] [N/A:N/A] Classification: [2:Partial Thickness] [N/A:N/A] Exudate Amount: [2:None Present] [N/A:N/A] Wound Margin: [2:Flat and Intact] [N/A:N/A] Granulation Amount: [2:None Present (0%)] [N/A:N/A] Necrotic Amount: [2:None Present (0%)] [N/A:N/A] Exposed Structures: [2:Fat Layer (Subcutaneous Tissue) Exposed: Yes] [N/A:N/A] Epithelialization: [2:Large (67-100%)] [N/A:N/A] Periwound Skin Texture: [2:Excoriation: Yes Induration: Yes Scarring: Yes Callus: No Crepitus: No Rash: No] [N/A:N/A] Periwound Skin Moisture: [2:Maceration: No Dry/Scaly: No] [N/A:N/A] Periwound Skin Color: [N/A:N/A] Atrophie Blanche: No Cyanosis: No Ecchymosis: No Erythema: No Hemosiderin Staining: No Mottled: No Pallor: No Rubor: No Tenderness on Palpation: No N/A N/A Wound Preparation: Ulcer  Cleansing: N/A N/A Rinsed/Irrigated with Saline Topical Anesthetic Applied: None Treatment Notes Electronic Signature(s) Signed: 11/26/2017 4:48:53 PM By: Roger Shelter Entered By: Roger Shelter on 11/26/2017 15:22:23 Clippard, [name] ([id number]) -------------------------------------------------------------------------------- Multi-Disciplinary Care Plan Details Patient Name: Danielle Williamson Date of Service: 11/26/2017 2:45 PM Medical Record Number: [id number] Patient Account Number: [id number] Date of Birth/Sex: [birth year] (85 y.o. Female) Treating RN: Roger Shelter Primary Care Namon Villarin: Harrel Lemon Other Clinician: Referring Merci Walthers: Harrel Lemon Treating Laiylah Roettger/Extender: Melburn Hake, HOYT Weeks in Treatment: 7 Active Inactive Electronic Signature(s) Signed: 11/28/2017 8:40:37 AM By: Roger Shelter Previous Signature: 11/26/2017 4:48:53 PM Version By: Roger Shelter Entered By: Roger Shelter on 11/28/2017 08:40:36 Crook, [name] ([id number]) -------------------------------------------------------------------------------- Pain Assessment Details Patient Name: Danielle Williamson Date of Service: 11/26/2017 2:45 PM Medical Record Number: [id number] Patient Account Number: [id number] Date of Birth/Sex: June 12, 1932 (85 y.o. Female) Treating RN: Roger Shelter Primary Care Nyellie Yetter: Harrel Lemon Other Clinician: Referring Kaye Mitro: Harrel Lemon Treating Athalee Esterline/Extender: Melburn Hake, HOYT Weeks in Treatment: 7 Active Problems Location of Pain Severity and Description of Pain Patient Has Paino No Site Locations Pain Management and Medication Current Pain Management: Electronic Signature(s) Signed: 11/26/2017 4:48:53 PM By: Roger Shelter Entered By: Roger Shelter on 11/26/2017 15:06:29 Isenberg, [name] ([id number]) -------------------------------------------------------------------------------- Patient/Caregiver Education Details Patient Name: Danielle Williamson Date of Service: 11/26/2017 2:45 PM Medical Record Number: [id number] Patient Account Number: [id number] Date of Birth/Gender: Mar 05, 1932 (85 y.o. Female) Treating RN: Roger Shelter Primary Care Physician: Harrel Lemon Other Clinician: Referring Physician: Harrel Lemon Treating Physician/Extender: Sharalyn Ink in Treatment: 7 Education Assessment Education Provided To: Patient Education Topics Provided Wound/Skin Impairment: Handouts: Skin Care Do's and Dont's Methods: Explain/Verbal Responses: State content correctly Electronic Signature(s) Signed: 11/26/2017 4:48:53 PM By: Roger Shelter Entered By: Roger Shelter on 11/26/2017 15:53:29 Fabel, [name] ([id number]) -------------------------------------------------------------------------------- Wound Assessment Details Patient Name: Danielle Williamson Date of Service: 11/26/2017 2:45 PM Medical Record Number: [id number] Patient Account Number: [id number] Date of Birth/Sex: [birth year] (85 y.o. Female) Treating RN: Roger Shelter Primary Care Jeffifer Rabold: Harrel Lemon Other Clinician: Referring Marcos Ruelas: Harrel Lemon Treating Zyia Kaneko/Extender: Melburn Hake, HOYT Weeks in Treatment: 7 Wound Status Wound Number: 2 Primary Trauma, Other Etiology: Wound Location: Right Lower Leg - Midline, Distal Wound Open Wounding Event: Trauma Status: Date Acquired: 10/02/2017 Comorbid Cataracts, Anemia, Lymphedema, Asthma, Weeks Of Treatment: 7 History: Chronic Obstructive Pulmonary Disease Clustered Wound: No (COPD), Arrhythmia, Rheumatoid Arthritis Photos Photo Uploaded By: Roger Shelter on 11/26/2017 16:41:41 Wound Measurements Length: (cm) 0 % Re Width: (cm) 0 % Re Depth: (cm) 0 Epit Area: (cm) 0 Tun Volume: (cm) 0 Und duction in Area: 100% duction in Volume: 100% helialization: Large (67-100%) neling: No ermining: No Wound Description Classification:  Partial Thickness Wound Margin: Flat and  Intact Exudate Amount: None Present Foul Odor After Cleansing: No Slough/Fibrino No Wound Bed Granulation Amount: None Present (0%) Exposed Structure Necrotic Amount: None Present (0%) Fat Layer (Subcutaneous Tissue) Exposed: Yes Periwound Skin Texture Texture Color No Abnormalities Noted: No No Abnormalities Noted: No Callus: No Atrophie Blanche: No Crepitus: No Cyanosis: No Excoriation: Yes Ecchymosis: No Induration: Yes Erythema: No Rash: No Hemosiderin Staining: No Massey, Yomira H. ([id number]) Scarring: Yes Mottled: No Pallor: No Moisture Rubor: No No Abnormalities Noted: No Dry / Scaly: No Maceration: No Wound Preparation Ulcer Cleansing: Rinsed/Irrigated with Saline Topical Anesthetic Applied: None Electronic Signature(s) Signed: 11/26/2017 4:48:53 PM By: Roger Shelter Entered By: Roger Shelter on 11/26/2017 15:15:56 Wollard, [name] ([id number]) -------------------------------------------------------------------------------- Vitals Details Patient Name: Danielle Williamson Date of Service: 11/26/2017 2:45 PM Medical Record Number: [id number] Patient Account Number: [id number] Date of Birth/Sex: 17-Jan-1932 (85 y.o. Female) Treating RN: Roger Shelter Primary Care Damica Gravlin: Harrel Lemon Other Clinician: Referring Starling Jessie: Harrel Lemon Treating Gidget Quizhpi/Extender: Melburn Hake, HOYT Weeks in Treatment: 7 Vital Signs Time Taken: 03:06 Temperature (F): 97.6 Height (in): 66 Pulse (bpm): 75 Respiratory Rate (breaths/min): 18 Blood Pressure (mmHg): 119/50 Reference Range: 80 - 120 mg / dl Electronic Signature(s) Signed: 11/26/2017 4:48:53 PM By: Roger Shelter Entered By: Roger Shelter on 11/26/2017 15:06:47

## 2017-11-27 NOTE — Progress Notes (Signed)
Danielle Williamson, Danielle Williamson ([id number]) Visit Report for 11/26/2017 Chief Complaint Document Details Patient Name: Danielle Williamson, Danielle Williamson Date of Service: 11/26/2017 2:45 PM Medical Record Number: [id number] Patient Account Number: [id number] Date of Birth/Sex: Nov 18, 1931 (85 y.o. Female) Treating RN: Roger Shelter Primary Care Provider: Harrel Lemon Other Clinician: Referring Provider: Harrel Lemon Treating Provider/Extender: Melburn Hake, HOYT Weeks in Treatment: 7 Information Obtained from: Patient Chief Complaint Patient seen for complaints of Non-Healing Wound to the right lower extremity which was caused by an accidental fall Electronic Signature(s) Signed: 11/26/2017 7:27:36 PM By: Worthy Keeler PA-C Entered By: Worthy Keeler on 11/26/2017 15:18:41 Villarruel, Danielle Williamson ([id number]) -------------------------------------------------------------------------------- HPI Details Patient Name: Danielle Williamson Date of Service: 11/26/2017 2:45 PM Medical Record Number: [id number] Patient Account Number: [id number] Date of Birth/Sex: 09/10/1932 (85 y.o. Female) Treating RN: Roger Shelter Primary Care Provider: Harrel Lemon Other Clinician: Referring Provider: Harrel Lemon Treating Provider/Extender: Melburn Hake, HOYT Weeks in Treatment: 7 History of Present Illness HPI Description: 84 year old patient has been seen recently by Dr. Hortencia Pilar of [facility] for leg pain and swelling associated with venous ulceration. She had been seen and treated with Unna boot therapy and the swelling got worse and there was discoloration of the leg. he recommended continuing compression stockings and elevation and exercise and he would not do any surgery at the present time. She should also continue with a lymphedema pump. he also noted evidence of arthrosclerosis of the lower extremity with claudication and noninvasive studies do not suggest clinically significant change. Noninvasive studies,  angiogram or surgery at this time. He recommended continuing walking and more formal exercise program. The arterial studies done in September of this year showed a normal ABI bilaterally with the right being 1.03 and the left being 1.14 with triphasic flow bilaterally. She had a DVT study done in August 2018 but I do not see any note off of venous reflux study being done. past medical history significant for COPD, depression, hypertension, panic attacks, peripheral vascular disease, status post cholecystectomy, colonoscopy, hysterectomy and right fourth toe surgery.she quit smoking about 4 years ago and does not drink alcohol. Most recently she was seen on 10/02/2017 after a fall and a skin tear on her right leg with no evidence of fracture.the skin tear was approximated with Steri-Strips and a Xeroform gauze dressing and a tetanus toxoid is up-to-date. 10/21/17 on evaluation today patient is seen for follow-up concerning her right lower extremity skin tears. She overall does have a dull pain to the area which is a much more sharp/burning discomfort with cleansing. She has extremely fragile skin and is prone to injury of this nature she states. The good news is the more proximal ulceration seems to be doing much better at this point and in fact is healing quite nicely. The inferior ulceration did have a lot of loose skin which was cleansed from around the wound. With that being said there is some Slough noted in the central aspect of the wound as well although due to the pain which is more significant with cleansing I was unable to perform any debridement today. No fevers, chills, nausea, or vomiting noted at this time. 11/04/17 on evaluation today patient's wounds appear to be doing much better. Her proximal wound appears likely to be completely closed although there was one small area I am still wondering if there was an opening this definitely does not appear to be significant. Subsequently them  greatly in regard to the inferior wound there  is some Slough noted in the inferior aspect of the wound bed that does need and required debridement. She still continues to be somewhat tender to palpation and even light touch. 11/18/17 On evaluation today patient's ulcers appear to be completely healed at this point. There may be just a slight opening in regard to the inferior ulceration although this is very pinpoint at this point. There does not appear to be any evidence of infection which is excellent news. She is having no discomfort. Overall I'm very pleased with how this has progressed in the short amount of time since we have been seeing her. She likewise is very pleased. 11/26/17 on evaluation today patient appears to be doing well in regard to her right lateral lower extremity ulcer she has actually healed in regard to both ulcerations. She has been tolerating the compression wraps without complication. She does have her Juxta-Lite compression which she has with her today as well. Fortunately there's no evidence of infection. Electronic Signature(s) Signed: 11/26/2017 7:27:36 PM By: Worthy Keeler PA-C Entered By: Worthy Keeler on 11/26/2017 15:35:27 Danielle Williamson, Danielle Williamson ([id number]) -------------------------------------------------------------------------------- Physical Exam Details Patient Name: Danielle Williamson Date of Service: 11/26/2017 2:45 PM Medical Record Number: [id number] Patient Account Number: [id number] Date of Birth/Sex: 1932/04/04 (85 y.o. Female) Treating RN: Roger Shelter Primary Care Provider: Harrel Lemon Other Clinician: Referring Provider: Harrel Lemon Treating Provider/Extender: Melburn Hake, HOYT Weeks in Treatment: 7 Constitutional Well-nourished and well-hydrated in no acute distress. Respiratory normal breathing without difficulty. Psychiatric this patient is able to make decisions and demonstrates good insight into disease process. Alert and Oriented x 3.  pleasant and cooperative. Notes Patient's wounds are solved be healed at this point she does have a lot of dry skin in regard to her lower extremity on the right but again I think this will improve as she progresses in regard to the ability to be able to wash and moisturize her leg. Electronic Signature(s) Signed: 11/26/2017 7:27:36 PM By: Worthy Keeler PA-C Entered By: Worthy Keeler on 11/26/2017 15:36:22 LILYONA, RICHNER ([id number]) -------------------------------------------------------------------------------- Physician Orders Details Patient Name: Danielle Williamson Date of Service: 11/26/2017 2:45 PM Medical Record Number: [id number] Patient Account Number: [id number] Date of Birth/Sex: September 04, 1932 (85 y.o. Female) Treating RN: Roger Shelter Primary Care Provider: Harrel Lemon Other Clinician: Referring Provider: Harrel Lemon Treating Provider/Extender: Melburn Hake, HOYT Weeks in Treatment: 7 Verbal / Phone Orders: No Diagnosis Coding ICD-10 Coding Code Description [phone] Laceration without foreign body, right lower leg, initial encounter L97.212 Non-pressure chronic ulcer of right calf with fat layer exposed I89.0 Lymphedema, not elsewhere classified Wound Cleansing Wound #2 Right,Distal,Midline Lower Leg o Cleanse wound with mild soap and water Skin Barriers/Peri-Wound Care Wound #2 Right,Distal,Midline Lower Leg o Moisturizing lotion Edema Control o Patient to wear own Juxtalite/Juzo compression garment. - reviewed and placed juxtalite wrap on right lower leg. patient states understanding of application and how to use properly. Home Health Wound #2 Right,Distal,Midline Lower Leg o D/C Home Health Services - treatment completed Discharge From [facility] Wound #2 Right,Distal,Midline Lower Leg o Discharge from [facility] - treatment completed Electronic Signature(s) Signed: 11/26/2017 4:48:53 PM By: Roger Shelter Signed: 11/26/2017 7:27:36 PM By:  Worthy Keeler PA-C Entered By: Roger Shelter on 11/26/2017 15:50:26 Danielle Williamson, Danielle Williamson ([id number]) -------------------------------------------------------------------------------- Problem List Details Patient Name: Danielle Williamson Date of Service: 11/26/2017 2:45 PM Medical Record Number: [id number] Patient Account Number: [id number] Date of Birth/Sex: September 15, 1932 (85 y.o. Female) Treating RN: Roger Shelter  Primary Care Provider: Harrel Lemon Other Clinician: Referring Provider: Harrel Lemon Treating Provider/Extender: Melburn Hake, HOYT Weeks in Treatment: 7 Active Problems ICD-10 Encounter Code Description Active Date Diagnosis S81.811A Laceration without foreign body, right lower leg, initial encounter 10/07/2017 Yes L97.212 Non-pressure chronic ulcer of right calf with fat layer exposed 10/07/2017 Yes I89.0 Lymphedema, not elsewhere classified 10/07/2017 Yes Inactive Problems Resolved Problems Electronic Signature(s) Signed: 11/26/2017 7:27:36 PM By: Worthy Keeler PA-C Entered By: Worthy Keeler on 11/26/2017 15:18:30 Danielle Williamson, Danielle Williamson ([id number]) -------------------------------------------------------------------------------- Progress Note Details Patient Name: Danielle Williamson Date of Service: 11/26/2017 2:45 PM Medical Record Number: [id number] Patient Account Number: [id number] Date of Birth/Sex: [birth year] (85 y.o. Female) Treating RN: Roger Shelter Primary Care Provider: Harrel Lemon Other Clinician: Referring Provider: Harrel Lemon Treating Provider/Extender: Melburn Hake, HOYT Weeks in Treatment: 7 Subjective Chief Complaint Information obtained from Patient Patient seen for complaints of Non-Healing Wound to the right lower extremity which was caused by an accidental fall History of Present Illness (HPI) 84 year old patient has been seen recently by Dr. Hortencia Pilar of [facility] for leg pain and swelling associated with venous  ulceration. She had been seen and treated with Unna boot therapy and the swelling got worse and there was discoloration of the leg. he recommended continuing compression stockings and elevation and exercise and he would not do any surgery at the present time. She should also continue with a lymphedema pump. he also noted evidence of arthrosclerosis of the lower extremity with claudication and noninvasive studies do not suggest clinically significant change. Noninvasive studies, angiogram or surgery at this time. He recommended continuing walking and more formal exercise program. The arterial studies done in September of this year showed a normal ABI bilaterally with the right being 1.03 and the left being 1.14 with triphasic flow bilaterally. She had a DVT study done in August 2018 but I do not see any note off of venous reflux study being done. past medical history significant for COPD, depression, hypertension, panic attacks, peripheral vascular disease, status post cholecystectomy, colonoscopy, hysterectomy and right fourth toe surgery.she quit smoking about 4 years ago and does not drink alcohol. Most recently she was seen on 10/02/2017 after a fall and a skin tear on her right leg with no evidence of fracture.the skin tear was approximated with Steri-Strips and a Xeroform gauze dressing and a tetanus toxoid is up-to-date. 10/21/17 on evaluation today patient is seen for follow-up concerning her right lower extremity skin tears. She overall does have a dull pain to the area which is a much more sharp/burning discomfort with cleansing. She has extremely fragile skin and is prone to injury of this nature she states. The good news is the more proximal ulceration seems to be doing much better at this point and in fact is healing quite nicely. The inferior ulceration did have a lot of loose skin which was cleansed from around the wound. With that being said there is some Slough noted in the central  aspect of the wound as well although due to the pain which is more significant with cleansing I was unable to perform any debridement today. No fevers, chills, nausea, or vomiting noted at this time. 11/04/17 on evaluation today patient's wounds appear to be doing much better. Her proximal wound appears likely to be completely closed although there was one small area I am still wondering if there was an opening this definitely does not appear to be significant. Subsequently them greatly in regard to  the inferior wound there is some Slough noted in the inferior aspect of the wound bed that does need and required debridement. She still continues to be somewhat tender to palpation and even light touch. 11/18/17 On evaluation today patient's ulcers appear to be completely healed at this point. There may be just a slight opening in regard to the inferior ulceration although this is very pinpoint at this point. There does not appear to be any evidence of infection which is excellent news. She is having no discomfort. Overall I'm very pleased with how this has progressed in the short amount of time since we have been seeing her. She likewise is very pleased. 11/26/17 on evaluation today patient appears to be doing well in regard to her right lateral lower extremity ulcer she has actually healed in regard to both ulcerations. She has been tolerating the compression wraps without complication. She does have her Juxta-Lite compression which she has with her today as well. Fortunately there's no evidence of infection. Patient History Danielle Williamson, Danielle Williamson. ([id number]) Information obtained from Patient. Family History Cancer - Father, Heart Disease - Mother, Hypertension - Mother, Kidney Disease - Father, Stroke - Paternal Grandparents, Thyroid Problems - Father, No family history of Diabetes, Lung Disease, Seizures, Tuberculosis. Social History Former smoker - 4 years ago, Marital Status - Widowed, Alcohol Use -  Rarely, Drug Use - No History, Caffeine Use - Daily. Review of Systems (ROS) Constitutional Symptoms (General Health) Denies complaints or symptoms of Fever, Chills. Respiratory The patient has no complaints or symptoms. Cardiovascular Complains or has symptoms of LE edema. Psychiatric The patient has no complaints or symptoms. Objective Constitutional Well-nourished and well-hydrated in no acute distress. Vitals Time Taken: 3:06 AM, Height: 66 in, Temperature: 97.6 F, Pulse: 75 bpm, Respiratory Rate: 18 breaths/min, Blood Pressure: 119/50 mmHg. Respiratory normal breathing without difficulty. Psychiatric this patient is able to make decisions and demonstrates good insight into disease process. Alert and Oriented x 3. pleasant and cooperative. General Notes: Patient's wounds are solved be healed at this point she does have a lot of dry skin in regard to her lower extremity on the right but again I think this will improve as she progresses in regard to the ability to be able to wash and moisturize her leg. Integumentary (Hair, Skin) Wound #2 status is Open. Original cause of wound was Trauma. The wound is located on the Right,Distal,Midline Lower Leg. The wound measures 0cm length x 0cm width x 0cm depth; 0cm^2 area and 0cm^3 volume. There is Fat Layer (Subcutaneous Tissue) Exposed exposed. There is no tunneling or undermining noted. There is a none present amount of drainage noted. The wound margin is flat and intact. There is no granulation within the wound bed. There is no necrotic tissue within the wound bed. The periwound skin appearance exhibited: Excoriation, Induration, Scarring. The periwound skin appearance did not exhibit: Callus, Crepitus, Rash, Dry/Scaly, Maceration, Atrophie Blanche, Cyanosis, Ecchymosis, Hemosiderin Staining, Mottled, Pallor, Rubor, Erythema. Danielle Williamson, Danielle Williamson ([id number]) Assessment Active Problems ICD-10 [phone] - Laceration without foreign body,  right lower leg, initial encounter L97.212 - Non-pressure chronic ulcer of right calf with fat layer exposed I89.0 - Lymphedema, not elsewhere classified Plan Wound Cleansing: Wound #2 Right,Distal,Midline Lower Leg: Cleanse wound with mild soap and water Skin Barriers/Peri-Wound Care: Wound #2 Right,Distal,Midline Lower Leg: Moisturizing lotion Edema Control: Patient to wear own Juxtalite/Juzo compression garment. - reviewed and placed juxtalite wrap on right lower leg. patient states understanding of application and how to use properly.  Home Health: Wound #2 Right,Distal,Midline Lower Leg: D/C Home Health Services - treatment completed Discharge From [facility] Services: Wound #2 Right,Distal,Midline Lower Leg: Discharge from [place] - treatment completed Patient appears to be completely healed at this point. We are going to discontinue wound care services and she will continue with the Juxta-Lite compression which she was shown how to apply today. She knows not to scrub her legs but just to wash with soap and her hand and hopefully with moisturizer the dry skin will eventually work its way off without causing any additional breakdown. She is in agreement with this plan. We'll see her in the future as needed if anything changes or worsens. Electronic Signature(s) Signed: 11/26/2017 7:27:36 PM By: Worthy Keeler PA-C Entered By: Worthy Keeler on 11/26/2017 18:11:44 Danielle Williamson, Danielle Williamson ([id number]) -------------------------------------------------------------------------------- ROS/PFSH Details Patient Name: Danielle Williamson Date of Service: 11/26/2017 2:45 PM Medical Record Number: [id number] Patient Account Number: [id number] Date of Birth/Sex: [birth year] (85 y.o. Female) Treating RN: Roger Shelter Primary Care Provider: Harrel Lemon Other Clinician: Referring Provider: Harrel Lemon Treating Provider/Extender: Melburn Hake, HOYT Weeks in Treatment: 7 Information Obtained  From Patient Wound History Do you currently have one or more open woundso Yes How many open wounds do you currently haveo 3 Approximately how long have you had your woundso 6 days Has your wound(s) ever healed and then re-openedo No Have you had any lab work done in the past montho No Have you tested positive for an antibiotic resistant organism (MRSA, VRE)o No Have you tested positive for osteomyelitis (bone infection)o No Have you had any tests for circulation on your legso Yes Who ordered the testo Dr. Arelia Sneddon Where was the test doneo few months ago Have you had other problems associated with your woundso Swelling Constitutional Symptoms (General Health) Complaints and Symptoms: Negative for: Fever; Chills Cardiovascular Complaints and Symptoms: Positive for: LE edema Medical History: Positive for: Arrhythmia Negative for: Angina; Congestive Heart Failure; Coronary Artery Disease; Deep Vein Thrombosis; Hypertension; Hypotension; Myocardial Infarction; Peripheral Arterial Disease; Peripheral Venous Disease; Phlebitis; Vasculitis Eyes Medical History: Positive for: Cataracts - surgery in past Negative for: Glaucoma; Optic Neuritis Ear/Nose/Mouth/Throat Medical History: Negative for: Chronic sinus problems/congestion; Middle ear problems Hematologic/Lymphatic Medical History: Positive for: Anemia; Lymphedema Negative for: Hemophilia; Human Immunodeficiency Virus; Sickle Cell Disease Respiratory Danielle Williamson, Danielle H. ([id number]) Complaints and Symptoms: No Complaints or Symptoms Medical History: Positive for: Asthma; Chronic Obstructive Pulmonary Disease (COPD) Negative for: Aspiration; Pneumothorax; Sleep Apnea; Tuberculosis Gastrointestinal Medical History: Negative for: Cirrhosis ; Colitis; Crohnos; Hepatitis A; Hepatitis B; Hepatitis C Endocrine Medical History: Negative for: Type I Diabetes; Type II Diabetes Genitourinary Medical History: Negative for: End Stage Renal  Disease Immunological Medical History: Negative for: Lupus Erythematosus; Raynaudos; Scleroderma Integumentary (Skin) Medical History: Negative for: History of Burn; History of pressure wounds Musculoskeletal Medical History: Positive for: Rheumatoid Arthritis Negative for: Gout; Osteoarthritis; Osteomyelitis Neurologic Medical History: Negative for: Dementia; Neuropathy; Quadriplegia; Paraplegia; Seizure Disorder Oncologic Medical History: Negative for: Received Chemotherapy; Received Radiation Psychiatric Complaints and Symptoms: No Complaints or Symptoms Medical History: Negative for: Anorexia/bulimia; Confinement Anxiety HBO Extended History Items Eyes: Cataracts Danielle Williamson, Danielle H. ([id number]) Immunizations Pneumococcal Vaccine: Received Pneumococcal Vaccination: Yes Implantable Devices Family and Social History Cancer: Yes - Father; Diabetes: No; Heart Disease: Yes - Mother; Hypertension: Yes - Mother; Kidney Disease: Yes - Father; Lung Disease: No; Seizures: No; Stroke: Yes - Paternal Grandparents; Thyroid Problems: Yes - Father; Tuberculosis: No; Former smoker - 4 years ago; Marital Status -  Widowed; Alcohol Use: Rarely; Drug Use: No History; Caffeine Use: Daily; Financial Concerns: No; Food, Clothing or Shelter Needs: No; Support System Lacking: No; Transportation Concerns: No; Advanced Directives: Yes (Not Provided); Patient does not want information on Advanced Directives; Do not resuscitate: Yes (Not Provided); Living Will: Yes (Not Provided); Medical Power of Attorney: Yes (Not Provided) Physician Affirmation I have reviewed and agree with the above information. Electronic Signature(s) Signed: 11/26/2017 4:48:53 PM By: Roger Shelter Signed: 11/26/2017 7:27:36 PM By: Worthy Keeler PA-C Entered By: Worthy Keeler on 11/26/2017 15:35:53 Danielle Williamson, Danielle Williamson ([id number]) -------------------------------------------------------------------------------- SuperBill  Details Patient Name: Danielle Williamson Date of Service: 11/26/2017 Medical Record Number: [id number] Patient Account Number: [id number] Date of Birth/Sex: 1932-04-25 (85 y.o. Female) Treating RN: Roger Shelter Primary Care Provider: Harrel Lemon Other Clinician: Referring Provider: Harrel Lemon Treating Provider/Extender: Melburn Hake, HOYT Weeks in Treatment: 7 Diagnosis Coding ICD-10 Codes Code Description [phone] Laceration without foreign body, right lower leg, initial encounter L97.212 Non-pressure chronic ulcer of right calf with fat layer exposed I89.0 Lymphedema, not elsewhere classified Facility Procedures CPT4 Code: [id number] Description: [phone] - WOUND CARE VISIT-LEV 2 EST PT Modifier: Quantity: 1 Physician Procedures CPT4 Code: 4076808 Description: 81103 - WC PHYS LEVEL 2 - EST PT ICD-10 Diagnosis Description S81.811A Laceration without foreign body, right lower leg, initial en L97.212 Non-pressure chronic ulcer of right calf with fat layer expo I89.0 Lymphedema, not elsewhere  classified Modifier: counter sed Quantity: 1 Electronic Signature(s) Signed: 11/26/2017 7:27:36 PM By: Worthy Keeler PA-C Previous Signature: 11/26/2017 4:48:53 PM Version By: Roger Shelter Entered By: Worthy Keeler on 11/26/2017 18:12:46

## 2018-03-24 ENCOUNTER — Ambulatory Visit (INDEPENDENT_AMBULATORY_CARE_PROVIDER_SITE_OTHER): Payer: Medicare Other | Admitting: Vascular Surgery

## 2018-04-07 ENCOUNTER — Encounter (INDEPENDENT_AMBULATORY_CARE_PROVIDER_SITE_OTHER): Payer: Self-pay | Admitting: Vascular Surgery

## 2018-04-07 ENCOUNTER — Ambulatory Visit (INDEPENDENT_AMBULATORY_CARE_PROVIDER_SITE_OTHER): Payer: Medicare Other | Admitting: Vascular Surgery

## 2018-04-07 VITALS — BP 127/80 | HR 72 | Ht 64.0 in | Wt 135.8 lb

## 2018-04-07 DIAGNOSIS — I890 Lymphedema, not elsewhere classified: Secondary | ICD-10-CM | POA: Diagnosis not present

## 2018-04-07 DIAGNOSIS — I872 Venous insufficiency (chronic) (peripheral): Secondary | ICD-10-CM

## 2018-04-07 DIAGNOSIS — I712 Thoracic aortic aneurysm, without rupture, unspecified: Secondary | ICD-10-CM

## 2018-04-07 NOTE — Progress Notes (Signed)
MRN : [id number]  Danielle Williamson is a 84 y.o. (1932-10-09) female who presents with chief complaint of  Chief Complaint  Patient presents with  . Follow-up    bilateral leg swelling   .  History of Present Illness: The patient returns to the office for followup evaluation regarding leg swelling.  The swelling has improved quite a bit and the pain associated with swelling has decreased substantially. There have not been any interval development of a ulcerations or wounds.  Since the previous visit the patient has been wearing graduated compression stockings and has noted little significant improvement in the lymphedema. The patient has been using compression routinely morning until night.  There have been no interval changes in lower extremity symptoms. No interval shortening of the patient's claudication distance or development of rest pain symptoms. No new ulcers or wounds have occurred since the last visit.  There have been no significant changes to the patient's overall health care.  The patient denies amaurosis fugax or recent TIA symptoms. There are no recent neurological changes noted. The patient denies history of DVT, PE or superficial thrombophlebitis. The patient denies recent episodes of angina or shortness of breath.       Current Meds  Medication Sig  . albuterol (PROVENTIL HFA;VENTOLIN HFA) 108 (90 Base) MCG/ACT inhaler Inhale 2 puffs into the lungs every 6 (six) hours as needed for wheezing.  Marland Kitchen albuterol (PROVENTIL) (2.5 MG/3ML) 0.083% nebulizer solution Take 2.5 mg by nebulization every 6 (six) hours as needed for wheezing.  Marland Kitchen ALPRAZolam (XANAX) 0.5 MG tablet   . amLODipine (NORVASC) 5 MG tablet Take 1 tablet by mouth daily.  Marland Kitchen atorvastatin (LIPITOR) 20 MG tablet Take 1 tablet by mouth daily.  . bisacodyl (DULCOLAX) 10 MG suppository Place 1 suppository (10 mg total) rectally daily.  . Calcium Carb-Cholecalciferol (CALCIUM-VITAMIN D) 500-200 MG-UNIT tablet Take by  mouth.  . calcium-vitamin D (OSCAL WITH D) 500-200 MG-UNIT TABS tablet Take by mouth.  . chlorpheniramine-HYDROcodone (TUSSIONEX) 10-8 MG/5ML SUER Take 5 mLs by mouth every 12 (twelve) hours as needed for cough.  . Cholecalciferol (VITAMIN D3) 5000 units TABS Take 1 tablet by mouth daily.  Marland Kitchen docusate sodium (COLACE) 100 MG capsule Take 1 capsule (100 mg total) by mouth 2 (two) times daily.  . fluticasone (FLOVENT HFA) 220 MCG/ACT inhaler Inhale 1 puff into the lungs 2 (two) times daily.  . furosemide (LASIX) 20 MG tablet Take 1 tablet by mouth daily.  Marland Kitchen guaiFENesin (MUCINEX) 600 MG 12 hr tablet Take 1 tablet (600 mg total) by mouth 2 (two) times daily.  . hydrocortisone 2.5 % cream Apply 1 application topically 2 (two) times daily.  Marland Kitchen ketoconazole (NIZORAL) 2 % shampoo Apply 1 application topically 2 (two) times a week.  . levothyroxine (SYNTHROID, LEVOTHROID) 88 MCG tablet   . meloxicam (MOBIC) 7.5 MG tablet   . metoprolol succinate (TOPROL-XL) 25 MG 24 hr tablet Take by mouth.  . mupirocin ointment (BACTROBAN) 2 % Apply two times a day for 7 days.  . potassium chloride SA (K-DUR,KLOR-CON) 20 MEQ tablet Take 1 tablet by mouth daily.  . predniSONE (DELTASONE) 5 MG tablet Take 1 tablet by mouth daily.  . Probiotic Product (PROBIOTIC-10 ULTIMATE PO) Take by mouth.  . Probiotic Product (PROBIOTIC-10) CAPS Take by mouth.  . sertraline (ZOLOFT) 50 MG tablet   . SPIRIVA RESPIMAT 2.5 MCG/ACT AERS Take 2 puffs by mouth daily.  . SYMBICORT 160-4.5 MCG/ACT inhaler   . torsemide (DEMADEX) 20  MG tablet Take by mouth.  . triamcinolone cream (KENALOG) 0.1 %     Past Medical History:  Diagnosis Date  . Allergy   . Aneurysm ([place])    thoracic aortic aneurysm  . Aneurysm, thoracic aortic ([facility]) Abdominal Aneurysm  . Anxiety   . Aortic insufficiency   . Arthritis   . Asthma   . CAD (coronary artery disease)    no h/o stents or CABG  . Cataract Bilateral Eyes  . Constipation   . COPD (chronic  obstructive pulmonary disease) ([facility])    on nocturnal o2  . Diastolic CHF ([place])   . History of adenomatous polyp of colon   . History of uterine cancer   . Hyperlipidemia   . Hypertension   . Pulmonary emphysema ([place])   . Skin cancer   . Thyroid disease    hypo  . UTI (lower urinary tract infection)     Past Surgical History:  Procedure Laterality Date  . ABDOMINAL HYSTERECTOMY    . BREAST CYST ASPIRATION Left 1985   neg  . CHOLECYSTECTOMY    . SHOULDER ARTHROSCOPY W/ CAPSULAR REPAIR     Left  . TOE SURGERY     Right toe surgery    Social History Social History   Tobacco Use  . Smoking status: Former Smoker    Years: 20.00  . Smokeless tobacco: Never Used  . Tobacco comment: Quit 2 years ago.   Substance Use Topics  . Alcohol use: Yes    Alcohol/week: 0.0 oz    Comment: rarely  . Drug use: No    Family History Family History  Problem Relation Age of Onset  . Breast cancer Cousin   . Heart attack Mother   . Hypertension Mother   . Bladder Cancer Father   . Prostate cancer Father   . Diabetes Sister   . Prostate cancer Brother   . AAA (abdominal aortic aneurysm) Brother     Allergies  Allergen Reactions  . Epinephrine   . Furosemide     Other reaction(s): Other (See Comments)  . Lopid [Gemfibrozil] Other (See Comments)    Other reaction(s): Muscle Pain Reaction: Muscle pain  . Lovastatin Other (See Comments)    Other reaction(s): Muscle Pain Reaction: Muscle pain  . Neosporin [Neomycin-Bacitracin Zn-Polymyx]   . Polysporin [Bacitracin-Polymyxin B]   . Sertraline Nausea Only  . Iodine Rash    Hives per patient (10/27/15)   . Moxifloxacin Palpitations  . Penicillin V Potassium Rash  . Penicillins Rash  . Sulfa Antibiotics Rash  . Vesicare [Solifenacin] Other (See Comments) and Palpitations    Reaction: Palpations     REVIEW OF SYSTEMS (Negative unless checked)  Constitutional: [] Weight loss  [] Fever  [] Chills Cardiac: [] Chest pain   [] Chest  pressure   [] Palpitations   [] Shortness of breath when laying flat   [] Shortness of breath with exertion. Vascular:  [] Pain in legs with walking   [] Pain in legs at rest  [] History of DVT   [] Phlebitis   [x] Swelling in legs   [x] Varicose veins   [] Non-healing ulcers Pulmonary:   [] Uses home oxygen   [] Productive cough   [] Hemoptysis   [] Wheeze  [] COPD   [] Asthma Neurologic:  [] Dizziness   [] Seizures   [] History of stroke   [] History of TIA  [] Aphasia   [] Vissual changes   [] Weakness or numbness in arm   [] Weakness or numbness in leg Musculoskeletal:   [] Joint swelling   [] Joint pain   [] Low back pain  Hematologic:  [] Easy bruising  [] Easy bleeding   [] Hypercoagulable state   [] Anemic Gastrointestinal:  [] Diarrhea   [] Vomiting  [] Gastroesophageal reflux/heartburn   [] Difficulty swallowing. Genitourinary:  [] Chronic kidney disease   [] Difficult urination  [] Frequent urination   [] Blood in urine Skin:  [] Rashes   [] Ulcers  Psychological:  [] History of anxiety   []  History of major depression.  Physical Examination  Vitals:   04/07/18 1139  BP: 127/80  Pulse: 72  Weight: 135 lb 12.8 oz (61.6 kg)  Height: 5\' 4"  (1.626 m)   Body mass index is 23.31 kg/m. Gen: WD/WN, NAD Head: [place]/AT, No temporalis wasting.  Ear/Nose/Throat: Hearing grossly intact, nares w/o erythema or drainage Eyes: PER, EOMI, sclera nonicteric.  Neck: Supple, no large masses.   Pulmonary:  Good air movement, no audible wheezing bilaterally, no use of accessory muscles.  Cardiac: RRR, no JVD Vascular: scattered varicosities present bilaterally.  severe venous stasis changes to the legs bilaterally.  2+ soft pitting edema Vessel Right Left  Radial Palpable Palpable  PT Not Palpable Not Palpable  DP Not Palpable Not Palpable  Gastrointestinal: Non-distended. No guarding/no peritoneal signs.  Musculoskeletal: M/S 5/5 throughout.  No deformity or atrophy.  Neurologic: CN 2-12 intact. Symmetrical.  Speech is fluent. Motor  exam as listed above. Psychiatric: Judgment intact, Mood & affect appropriate for pt's clinical situation. Dermatologic: No rashes or ulcers noted.  No changes consistent with cellulitis. Lymph : No lichenification or skin changes of chronic lymphedema.  CBC Lab Results  Component Value Date   WBC 17.6 (H) 10/29/2015   HGB 11.8 (L) 10/29/2015   HCT 35.7 10/29/2015   MCV 91.4 10/29/2015   PLT 239 10/29/2015    BMET    Component Value Date/Time   NA 140 10/28/2015 0505   K 3.7 10/28/2015 0505   CL 109 10/28/2015 0505   CO2 25 10/28/2015 0505   GLUCOSE 131 (H) 10/28/2015 0505   BUN 13 10/28/2015 0505   CREATININE 1.00 04/16/2016 1319   CALCIUM 8.1 (L) 10/28/2015 0505   GFRNONAA >60 10/28/2015 0505   GFRAA >60 10/28/2015 0505   CrCl cannot be calculated (Patient's most recent lab result is older than the maximum 21 days allowed.).  COAG No results found for: INR, PROTIME  Radiology No results found.    Assessment/Plan 1. Chronic venous insufficiency  No surgery or intervention at this point in time.    I have reviewed my discussion with the patient regarding lymphedema and why it  causes symptoms.  Patient will continue wearing graduated compression stockings class 1 (20-30 mmHg) on a daily basis a prescription was given. The patient is reminded to put the stockings on first thing in the morning and removing them in the evening. The patient is instructed specifically not to sleep in the stockings.   In addition, behavioral modification throughout the day will be continued.  This will include frequent elevation (such as in a recliner), use of over the counter pain medications as needed and exercise such as walking.  I have reviewed systemic causes for chronic edema such as liver, kidney and cardiac etiologies and there does not appear to be any significant changes in these organ systems over the past year.  The patient is under the impression that these organ systems are all  stable and unchanged.    The patient will continue aggressive use of the  lymph pump.  This will continue to improve the edema control and prevent sequela such as ulcers and infections.  The patient will follow-up with me on an annual basis.    2. Lymphedema  No surgery or intervention at this point in time.    I have reviewed my discussion with the patient regarding lymphedema and why it  causes symptoms.  Patient will continue wearing graduated compression stockings class 1 (20-30 mmHg) on a daily basis a prescription was given. The patient is reminded to put the stockings on first thing in the morning and removing them in the evening. The patient is instructed specifically not to sleep in the stockings.   In addition, behavioral modification throughout the day will be continued.  This will include frequent elevation (such as in a recliner), use of over the counter pain medications as needed and exercise such as walking.  I have reviewed systemic causes for chronic edema such as liver, kidney and cardiac etiologies and there does not appear to be any significant changes in these organ systems over the past year.  The patient is under the impression that these organ systems are all stable and unchanged.    The patient will continue aggressive use of the  lymph pump.  This will continue to improve the edema control and prevent sequela such as ulcers and infections.   The patient will follow-up with me on an annual basis.    3. Thoracic aortic aneurysm without rupture ([place]) Recommend:  The patient is otherwise in reasonable health.   Patient will require CT of the chest in order to monitor the TAA.  - CT CHEST LIMITED WO CONTRAST; Future   Hortencia Pilar, MD  04/07/2018 11:45 AM

## 2018-04-15 ENCOUNTER — Telehealth (INDEPENDENT_AMBULATORY_CARE_PROVIDER_SITE_OTHER): Payer: Self-pay

## 2018-04-15 NOTE — Telephone Encounter (Signed)
Manuela Schwartz called from radiology and stated that this patient's order should be for a regular CT, but the order reads differently.  It should read CT chest w/o contrast.

## 2018-04-16 ENCOUNTER — Other Ambulatory Visit (INDEPENDENT_AMBULATORY_CARE_PROVIDER_SITE_OTHER): Payer: Self-pay

## 2018-04-16 DIAGNOSIS — I712 Thoracic aortic aneurysm, without rupture, unspecified: Secondary | ICD-10-CM

## 2018-04-16 NOTE — Telephone Encounter (Signed)
I checked in his notes, and he did want this patient to have routine checks because of her TAA. I put the order in again according to how the hospital said that it needs to read on their side.

## 2018-04-16 NOTE — Telephone Encounter (Signed)
Please speak with the provider who placed the order for their clarification.

## 2018-04-25 ENCOUNTER — Ambulatory Visit: Payer: Medicare Other

## 2018-04-25 ENCOUNTER — Telehealth (INDEPENDENT_AMBULATORY_CARE_PROVIDER_SITE_OTHER): Payer: Self-pay

## 2018-04-25 NOTE — Telephone Encounter (Signed)
Danielle Williamson called and stated that the patient needs a creatine lab added to her order for the scheduled CT for next week.  She also states that the CT needs to read "With Contrast".

## 2018-04-28 ENCOUNTER — Other Ambulatory Visit (INDEPENDENT_AMBULATORY_CARE_PROVIDER_SITE_OTHER): Payer: Self-pay

## 2018-04-28 ENCOUNTER — Other Ambulatory Visit (INDEPENDENT_AMBULATORY_CARE_PROVIDER_SITE_OTHER): Payer: Self-pay | Admitting: Vascular Surgery

## 2018-04-28 ENCOUNTER — Telehealth (INDEPENDENT_AMBULATORY_CARE_PROVIDER_SITE_OTHER): Payer: Self-pay

## 2018-04-28 DIAGNOSIS — I712 Thoracic aortic aneurysm, without rupture, unspecified: Secondary | ICD-10-CM

## 2018-04-28 NOTE — Telephone Encounter (Signed)
Sure. Please put an order in for a BUN / Creatinine.

## 2018-04-29 ENCOUNTER — Ambulatory Visit
Admission: RE | Admit: 2018-04-29 | Discharge: 2018-04-29 | Disposition: A | Discharge status: Home / Self Care | Payer: Medicare Other | Source: Ambulatory Visit | Attending: Vascular Surgery | Admitting: Vascular Surgery

## 2018-04-29 ENCOUNTER — Other Ambulatory Visit
Admission: RE | Admit: 2018-04-29 | Discharge: 2018-04-29 | Disposition: A | Discharge status: Home / Self Care | Payer: Medicare Other | Source: Ambulatory Visit | Attending: Vascular Surgery | Admitting: Vascular Surgery

## 2018-04-29 ENCOUNTER — Other Ambulatory Visit (INDEPENDENT_AMBULATORY_CARE_PROVIDER_SITE_OTHER): Payer: Self-pay

## 2018-04-29 DIAGNOSIS — I712 Thoracic aortic aneurysm, without rupture, unspecified: Secondary | ICD-10-CM

## 2018-04-29 DIAGNOSIS — J439 Emphysema, unspecified: Secondary | ICD-10-CM | POA: Insufficient documentation

## 2018-04-29 DIAGNOSIS — J984 Other disorders of lung: Secondary | ICD-10-CM | POA: Insufficient documentation

## 2018-04-29 DIAGNOSIS — I2510 Atherosclerotic heart disease of native coronary artery without angina pectoris: Secondary | ICD-10-CM | POA: Diagnosis not present

## 2018-04-29 DIAGNOSIS — I513 Intracardiac thrombosis, not elsewhere classified: Secondary | ICD-10-CM | POA: Insufficient documentation

## 2018-04-29 LAB — CREATININE, SERUM
Creatinine, Ser: 0.96 mg/dL (ref 0.44–1.00)
GFR, EST NON AFRICAN AMERICAN: 53 mL/min — AB (ref >60)

## 2018-04-29 LAB — BUN: BUN: 18 mg/dL (ref 8–23)

## 2018-04-29 MED ORDER — IOPAMIDOL (ISOVUE-370) INJECTION 76%
100.0000 mL | Freq: Once | INTRAVENOUS | Status: AC | PRN
  Administered 2018-04-29: 100 mL via INTRAVENOUS

## 2018-05-19 ENCOUNTER — Encounter (INDEPENDENT_AMBULATORY_CARE_PROVIDER_SITE_OTHER): Payer: Self-pay

## 2018-05-19 ENCOUNTER — Ambulatory Visit (INDEPENDENT_AMBULATORY_CARE_PROVIDER_SITE_OTHER): Payer: Medicare Other | Admitting: Vascular Surgery

## 2018-05-19 ENCOUNTER — Encounter (INDEPENDENT_AMBULATORY_CARE_PROVIDER_SITE_OTHER): Payer: Self-pay | Admitting: Vascular Surgery

## 2018-05-19 VITALS — BP 121/66 | HR 70 | Resp 16 | Ht 64.0 in | Wt 140.2 lb

## 2018-05-19 DIAGNOSIS — I716 Thoracoabdominal aortic aneurysm, without rupture, unspecified: Secondary | ICD-10-CM

## 2018-05-19 DIAGNOSIS — I70213 Atherosclerosis of native arteries of extremities with intermittent claudication, bilateral legs: Secondary | ICD-10-CM

## 2018-05-19 DIAGNOSIS — I712 Thoracic aortic aneurysm, without rupture, unspecified: Secondary | ICD-10-CM

## 2018-05-19 DIAGNOSIS — I890 Lymphedema, not elsewhere classified: Secondary | ICD-10-CM

## 2018-05-19 DIAGNOSIS — I872 Venous insufficiency (chronic) (peripheral): Secondary | ICD-10-CM | POA: Diagnosis not present

## 2018-05-21 ENCOUNTER — Encounter (INDEPENDENT_AMBULATORY_CARE_PROVIDER_SITE_OTHER): Payer: Self-pay | Admitting: Vascular Surgery

## 2018-05-21 NOTE — Progress Notes (Signed)
MRN : [id number]  Danielle Williamson is a 84 y.o. (1931/11/10) female who presents with chief complaint of  Chief Complaint  Patient presents with  . Follow-up    ct results  .  History of Present Illness:   The patient returns to the office for surveillance of a known thoracic aortic aneurysm. Patient denies chest pain or back pain, no other thoracic complaints. No changes suggesting embolic episodes.   There have been no interval changes in the patient's overall health care since his last visit.  Patient denies amaurosis fugax or TIA symptoms. There is no history of claudication or rest pain symptoms of the lower extremities. The patient denies angina or shortness of breath.   The patient is also here for follow up regarding leg swelling.  The swelling has improved quite a bit and the pain associated with swelling has decreased substantially. There have not been any interval development of a ulcerations or wounds.  Since the previous visit the patient has been wearing graduated compression stockings and has noted little significant improvement in the lymphedema. The patient has been using compression routinely morning until night.  There have been no interval changes in lower extremity symptoms. No interval shortening of the patient's claudication distance or development of rest pain symptoms. No new ulcers or wounds have occurred since the last visit.  There have been no significant changes to the patient's overall health care.  The patient denies amaurosis fugax or recent TIA symptoms. There are no recent neurological changes noted. The patient denies history of DVT, PE or superficial thrombophlebitis. The patient denies recent episodes of angina or shortness of breath.   CT angiogram shows a stable TAA   Current Meds  Medication Sig  . albuterol (PROVENTIL HFA;VENTOLIN HFA) 108 (90 Base) MCG/ACT inhaler Inhale 2 puffs into the lungs every 6 (six) hours as needed for  wheezing.  Marland Kitchen albuterol (PROVENTIL) (2.5 MG/3ML) 0.083% nebulizer solution Take 2.5 mg by nebulization every 6 (six) hours as needed for wheezing.  Marland Kitchen ALPRAZolam (XANAX) 0.5 MG tablet   . amLODipine (NORVASC) 5 MG tablet Take 1 tablet by mouth daily.  Marland Kitchen atorvastatin (LIPITOR) 20 MG tablet Take 1 tablet by mouth daily.  . bisacodyl (DULCOLAX) 10 MG suppository Place 1 suppository (10 mg total) rectally daily.  . Calcium Carb-Cholecalciferol (CALCIUM-VITAMIN D) 500-200 MG-UNIT tablet Take by mouth.  . calcium-vitamin D (OSCAL WITH D) 500-200 MG-UNIT TABS tablet Take by mouth.  . chlorpheniramine-HYDROcodone (TUSSIONEX) 10-8 MG/5ML SUER Take 5 mLs by mouth every 12 (twelve) hours as needed for cough.  . Cholecalciferol (VITAMIN D3) 5000 units TABS Take 1 tablet by mouth daily.  Marland Kitchen docusate sodium (COLACE) 100 MG capsule Take 1 capsule (100 mg total) by mouth 2 (two) times daily.  . fluticasone (FLOVENT HFA) 220 MCG/ACT inhaler Inhale 1 puff into the lungs 2 (two) times daily.  . furosemide (LASIX) 20 MG tablet Take 1 tablet by mouth daily.  Marland Kitchen guaiFENesin (MUCINEX) 600 MG 12 hr tablet Take 1 tablet (600 mg total) by mouth 2 (two) times daily.  . hydrocortisone 2.5 % cream Apply 1 application topically 2 (two) times daily.  Marland Kitchen ketoconazole (NIZORAL) 2 % shampoo Apply 1 application topically 2 (two) times a week.  . levothyroxine (SYNTHROID, LEVOTHROID) 88 MCG tablet   . meloxicam (MOBIC) 7.5 MG tablet   . metoprolol succinate (TOPROL-XL) 25 MG 24 hr tablet Take by mouth.  . mupirocin ointment (BACTROBAN) 2 % Apply two times a day  for 7 days.  . potassium chloride SA (K-DUR,KLOR-CON) 20 MEQ tablet Take 1 tablet by mouth daily.  . predniSONE (DELTASONE) 5 MG tablet Take 1 tablet by mouth daily.  . Probiotic Product (PROBIOTIC-10 ULTIMATE PO) Take by mouth.  . Probiotic Product (PROBIOTIC-10) CAPS Take by mouth.  . sertraline (ZOLOFT) 50 MG tablet   . SPIRIVA RESPIMAT 2.5 MCG/ACT AERS Take 2 puffs by  mouth daily.  . SYMBICORT 160-4.5 MCG/ACT inhaler   . torsemide (DEMADEX) 20 MG tablet Take by mouth.  . triamcinolone cream (KENALOG) 0.1 %     Past Medical History:  Diagnosis Date  . Allergy   . Aneurysm ([place])    thoracic aortic aneurysm  . Aneurysm, thoracic aortic (HCC) Abdominal Aneurysm  . Anxiety   . Aortic insufficiency   . Arthritis   . Asthma   . CAD (coronary artery disease)    no h/o stents or CABG  . Cataract Bilateral Eyes  . Constipation   . COPD (chronic obstructive pulmonary disease) (HCC)    on nocturnal o2  . Diastolic CHF ([place])   . History of adenomatous polyp of colon   . History of uterine cancer   . Hyperlipidemia   . Hypertension   . Pulmonary emphysema ([place])   . Skin cancer   . Thyroid disease    hypo  . UTI (lower urinary tract infection)     Past Surgical History:  Procedure Laterality Date  . ABDOMINAL HYSTERECTOMY    . BREAST CYST ASPIRATION Left 1985   neg  . CHOLECYSTECTOMY    . SHOULDER ARTHROSCOPY W/ CAPSULAR REPAIR     Left  . TOE SURGERY     Right toe surgery    Social History Social History   Tobacco Use  . Smoking status: Former Smoker    Years: 20.00  . Smokeless tobacco: Never Used  . Tobacco comment: Quit 2 years ago.   Substance Use Topics  . Alcohol use: Yes    Alcohol/week: 0.0 oz    Comment: rarely  . Drug use: No    Family History Family History  Problem Relation Age of Onset  . Breast cancer Cousin   . Heart attack Mother   . Hypertension Mother   . Bladder Cancer Father   . Prostate cancer Father   . Diabetes Sister   . Prostate cancer Brother   . AAA (abdominal aortic aneurysm) Brother     Allergies  Allergen Reactions  . Epinephrine   . Furosemide     Other reaction(s): Other (See Comments)  . Lopid [Gemfibrozil] Other (See Comments)    Other reaction(s): Muscle Pain Reaction: Muscle pain  . Lovastatin Other (See Comments)    Other reaction(s): Muscle Pain Reaction: Muscle pain  .  Neosporin [Neomycin-Bacitracin Zn-Polymyx]   . Polysporin [Bacitracin-Polymyxin B]   . Sertraline Nausea Only  . Iodine Rash    Hives per patient (10/27/15)   . Moxifloxacin Palpitations  . Penicillin V Potassium Rash  . Penicillins Rash  . Sulfa Antibiotics Rash  . Vesicare [Solifenacin] Other (See Comments) and Palpitations    Reaction: Palpations     REVIEW OF SYSTEMS (Negative unless checked)  Constitutional: [] Weight loss  [] Fever  [] Chills Cardiac: [] Chest pain   [] Chest pressure   [] Palpitations   [] Shortness of breath when laying flat   [] Shortness of breath with exertion. Vascular:  [] Pain in legs with walking   [] Pain in legs at rest  [] History of DVT   [] Phlebitis   [  x]Swelling in legs   [] Varicose veins   [] Non-healing ulcers Pulmonary:   [] Uses home oxygen   [] Productive cough   [] Hemoptysis   [] Wheeze  [] COPD   [] Asthma Neurologic:  [] Dizziness   [] Seizures   [] History of stroke   [] History of TIA  [] Aphasia   [] Vissual changes   [] Weakness or numbness in arm   [] Weakness or numbness in leg Musculoskeletal:   [] Joint swelling   [] Joint pain   [] Low back pain Hematologic:  [] Easy bruising  [] Easy bleeding   [] Hypercoagulable state   [] Anemic Gastrointestinal:  [] Diarrhea   [] Vomiting  [] Gastroesophageal reflux/heartburn   [] Difficulty swallowing. Genitourinary:  [] Chronic kidney disease   [] Difficult urination  [] Frequent urination   [] Blood in urine Skin:  [x] Rashes   [] Ulcers  Psychological:  [] History of anxiety   []  History of major depression.  Physical Examination  Vitals:   05/19/18 1018  BP: 121/66  Pulse: 70  Resp: 16  Weight: 140 lb 3.2 oz (63.6 kg)  Height: 5\' 4"  (1.626 m)   Body mass index is 24.07 kg/m. Gen: WD/WN, NAD Head: [place]/AT, No temporalis wasting.  Ear/Nose/Throat: Hearing grossly intact, nares w/o erythema or drainage Eyes: PER, EOMI, sclera nonicteric.  Neck: Supple, no large masses.   Pulmonary:  Good air movement, no audible wheezing  bilaterally, no use of accessory muscles.  Cardiac: RRR, no JVD Vascular: scattered varicosities present bilaterally.  Mild venous stasis changes to the legs bilaterally.  3-2+ soft pitting edema Vessel Right Left  Radial Palpable Palpable  PT Palpable Palpable  DP Palpable Palpable  Gastrointestinal: Non-distended. No guarding/no peritoneal signs.  Musculoskeletal: M/S 5/5 throughout.  No deformity or atrophy.  Neurologic: CN 2-12 intact. Symmetrical.  Speech is fluent. Motor exam as listed above. Psychiatric: Judgment intact, Mood & affect appropriate for pt's clinical situation. Dermatologic: mild venous rashes no ulcers noted.  No changes consistent with cellulitis. Lymph : No lichenification or skin changes of chronic lymphedema.  CBC Lab Results  Component Value Date   WBC 17.6 (H) 10/29/2015   HGB 11.8 (L) 10/29/2015   HCT 35.7 10/29/2015   MCV 91.4 10/29/2015   PLT 239 10/29/2015    BMET    Component Value Date/Time   NA 140 10/28/2015 0505   K 3.7 10/28/2015 0505   CL 109 10/28/2015 0505   CO2 25 10/28/2015 0505   GLUCOSE 131 (H) 10/28/2015 0505   BUN 18 04/29/2018 1007   CREATININE 0.96 04/29/2018 1007   CALCIUM 8.1 (L) 10/28/2015 0505   GFRNONAA 53 (L) 04/29/2018 1007   GFRAA >60 04/29/2018 1007   CrCl cannot be calculated (Patient's most recent lab result is older than the maximum 21 days allowed.).  COAG No results found for: INR, PROTIME  Radiology Ct Angio Chest Aorta W/cm &/or Wo/cm  Result Date: 04/29/2018 CLINICAL DATA:  Follow-up of aneurysmal disease of the descending thoracic aorta EXAM: CT ANGIOGRAPHY CHEST WITH CONTRAST TECHNIQUE: Multidetector CT imaging of the chest was performed using the standard protocol during bolus administration of intravenous contrast. Multiplanar CT image reconstructions and MIPs were obtained to evaluate the vascular anatomy. CONTRAST:  174mL ISOVUE-370 IOPAMIDOL (ISOVUE-370) INJECTION 76% COMPARISON:  04/16/2016 and  01/22/2014 FINDINGS: Cardiovascular: The aneurysmal disease of the mid to distal descending thoracic aorta is stable since the prior study in 2017 and slightly more prominent compared to 2015 with maximal diameter of 4.0-4.1 cm. Irregular plaque and mural thrombus within the aneurysm shows stable morphology with maximal thickness of mural thrombus posteriorly  of approximately 12 mm. No evidence of dissection or hemorrhage. Other aortic dimensions are stable with the aortic root measuring approximately 3.1 cm, ascending thoracic aorta measuring 3.2 cm and arch measuring 2.9 cm. The distal descending thoracic aorta just above the hiatus measures 2.6 cm. Normal patency of proximal great vessels with normal variant origin of the left vertebral artery off of the aortic arch. Stable and normal heart size. No pericardial fluid. Stable calcified coronary arterial plaque in a 3 vessel distribution. Stable calcifications associated with the aortic valve and mitral valve annulus. Stable mildly dilated central pulmonary arteries. Mediastinum/Nodes: No enlarged mediastinal, hilar, or axillary lymph nodes. Thyroid gland, trachea, and esophagus demonstrate no significant findings. Lungs/Pleura: Stable scattered parenchymal scarring in the lungs with potential mild emphysematous lung disease. Stable calcified granuloma in the subpleural right lower lobe. There is no evidence of pulmonary edema, consolidation, pneumothorax or pleural fluid. Upper Abdomen: No acute abnormality. Musculoskeletal: No chest wall abnormality. No acute or significant osseous findings. Review of the MIP images confirms the above findings. IMPRESSION: 1. Aneurysmal disease of the mid to distal descending thoracic aorta is stable since 2017 and slightly more prominent compared to the 2015 study with maximal diameter of 4.0-4.1 cm. Stable appearance of irregular plaque and mural thrombus within the aneurysm. Other aortic dimensions appears stable in the chest.  2. Stable appearance of coronary atherosclerosis with calcified plaque in a 3 vessel distribution. 3. Stable pulmonary scarring in both lungs with potential mild underlying emphysematous lung disease. Aortic aneurysm NOS (ICD10-I71.9). Electronically Signed   By: Aletta Edouard M.D.   On: 04/29/2018 12:51      Assessment/Plan 1. Thoracic aortic aneurysm without rupture ([place]) No surgery or intervention at this time. The patient has an asymptomatic thoracic aortic aneurysm that is less than 5 cm in maximal diameter.  I have discussed the natural history of thoracic aortic aneurysm and the small risk of rupture for aneurysm less than 6 cm in size.  However, as these small aneurysms tend to enlarge over time, continued surveillance with  CT scan is mandatory.  I have also discussed optimizing medical management with hypertension and lipid control and the importance of abstinence from tobacco.  The patient is also encouraged to exercise a minimum of 30 minutes 4 times a week.  Should the patient develop new onset abdominal or back pain or signs of peripheral embolization they are instructed to seek medical attention immediately and to alert the physician providing care that they have an aneurysm.  The patient voices their understanding. The patient will return in 12 months with an CT scan   2. Chronic venous insufficiency No surgery or intervention at this point in time.    I have had a long discussion with the patient regarding venous insufficiency and why it  causes symptoms. I have discussed with the patient the chronic skin changes that accompany venous insufficiency and the long term sequela such as infection and ulceration.  Patient will begin wearing graduated compression stockings class 1 (20-30 mmHg) or compression wraps on a daily basis a prescription was given. The patient will put the stockings on first thing in the morning and removing them in the evening. The patient is instructed  specifically not to sleep in the stockings.    In addition, behavioral modification including several periods of elevation of the lower extremities during the day will be continued. I have demonstrated that proper elevation is a position with the ankles at heart level.  The patient  is instructed to begin routine exercise, especially walking on a daily basis   3. Atherosclerosis of native artery of both lower extremities with intermittent claudication (HCC)  Recommend:  The patient has evidence of atherosclerosis of the lower extremities with claudication.  The patient does not voice lifestyle limiting changes at this point in time.  Noninvasive studies do not suggest clinically significant change.  No invasive studies, angiography or surgery at this time The patient should continue walking and begin a more formal exercise program.  The patient should continue antiplatelet therapy and aggressive treatment of the lipid abnormalities  No changes in the patient's medications at this time  The patient should continue wearing graduated compression socks 10-15 mmHg strength to control the mild edema.    4. Lymphedema See #2  5. Thoracoabdominal aortic aneurysm, without rupture ([place]) See #1 - CT Angio Chest W/Cm &/Or Wo Cm; Future    Hortencia Pilar, MD  05/21/2018 9:27 PM

## 2019-01-05 ENCOUNTER — Other Ambulatory Visit (HOSPITAL_COMMUNITY): Payer: Self-pay | Admitting: Specialist

## 2019-01-05 ENCOUNTER — Other Ambulatory Visit: Payer: Self-pay | Admitting: Specialist

## 2019-01-05 ENCOUNTER — Ambulatory Visit
Admission: RE | Admit: 2019-01-05 | Discharge: 2019-01-05 | Disposition: A | Discharge status: Home / Self Care | Payer: Medicare Other | Source: Ambulatory Visit | Attending: Specialist | Admitting: Specialist

## 2019-01-05 ENCOUNTER — Other Ambulatory Visit: Payer: Self-pay

## 2019-01-05 DIAGNOSIS — R079 Chest pain, unspecified: Secondary | ICD-10-CM | POA: Insufficient documentation

## 2019-01-05 DIAGNOSIS — I712 Thoracic aortic aneurysm, without rupture, unspecified: Secondary | ICD-10-CM

## 2019-01-07 ENCOUNTER — Encounter: Admission: EM | Disposition: E | Payer: Self-pay | Source: Home / Self Care | Attending: Cardiology

## 2019-01-07 ENCOUNTER — Emergency Department: Payer: Medicare Other

## 2019-01-07 ENCOUNTER — Inpatient Hospital Stay
Admission: EM | Admit: 2019-01-07 | Discharge: 2019-01-21 | DRG: 247 | Disposition: E | Payer: Medicare Other | Attending: Cardiology | Admitting: Cardiology

## 2019-01-07 DIAGNOSIS — Z79899 Other long term (current) drug therapy: Secondary | ICD-10-CM | POA: Diagnosis not present

## 2019-01-07 DIAGNOSIS — Z881 Allergy status to other antibiotic agents status: Secondary | ICD-10-CM | POA: Diagnosis not present

## 2019-01-07 DIAGNOSIS — Z803 Family history of malignant neoplasm of breast: Secondary | ICD-10-CM

## 2019-01-07 DIAGNOSIS — I350 Nonrheumatic aortic (valve) stenosis: Secondary | ICD-10-CM | POA: Diagnosis present

## 2019-01-07 DIAGNOSIS — Z7951 Long term (current) use of inhaled steroids: Secondary | ICD-10-CM

## 2019-01-07 DIAGNOSIS — Z8249 Family history of ischemic heart disease and other diseases of the circulatory system: Secondary | ICD-10-CM

## 2019-01-07 DIAGNOSIS — I712 Thoracic aortic aneurysm, without rupture: Secondary | ICD-10-CM | POA: Diagnosis present

## 2019-01-07 DIAGNOSIS — Z880 Allergy status to penicillin: Secondary | ICD-10-CM

## 2019-01-07 DIAGNOSIS — Z87891 Personal history of nicotine dependence: Secondary | ICD-10-CM

## 2019-01-07 DIAGNOSIS — F419 Anxiety disorder, unspecified: Secondary | ICD-10-CM | POA: Diagnosis present

## 2019-01-07 DIAGNOSIS — Z882 Allergy status to sulfonamides status: Secondary | ICD-10-CM | POA: Diagnosis not present

## 2019-01-07 DIAGNOSIS — I2109 ST elevation (STEMI) myocardial infarction involving other coronary artery of anterior wall: Secondary | ICD-10-CM | POA: Diagnosis present

## 2019-01-07 DIAGNOSIS — E785 Hyperlipidemia, unspecified: Secondary | ICD-10-CM | POA: Diagnosis present

## 2019-01-07 DIAGNOSIS — J439 Emphysema, unspecified: Secondary | ICD-10-CM | POA: Diagnosis present

## 2019-01-07 DIAGNOSIS — I2510 Atherosclerotic heart disease of native coronary artery without angina pectoris: Secondary | ICD-10-CM | POA: Diagnosis present

## 2019-01-07 DIAGNOSIS — I5032 Chronic diastolic (congestive) heart failure: Secondary | ICD-10-CM | POA: Diagnosis present

## 2019-01-07 DIAGNOSIS — Z79890 Hormone replacement therapy (postmenopausal): Secondary | ICD-10-CM

## 2019-01-07 DIAGNOSIS — R4182 Altered mental status, unspecified: Secondary | ICD-10-CM | POA: Diagnosis present

## 2019-01-07 DIAGNOSIS — I213 ST elevation (STEMI) myocardial infarction of unspecified site: Secondary | ICD-10-CM

## 2019-01-07 DIAGNOSIS — I2102 ST elevation (STEMI) myocardial infarction involving left anterior descending coronary artery: Secondary | ICD-10-CM | POA: Diagnosis present

## 2019-01-07 DIAGNOSIS — R410 Disorientation, unspecified: Secondary | ICD-10-CM

## 2019-01-07 DIAGNOSIS — Z8042 Family history of malignant neoplasm of prostate: Secondary | ICD-10-CM | POA: Diagnosis not present

## 2019-01-07 DIAGNOSIS — Z833 Family history of diabetes mellitus: Secondary | ICD-10-CM | POA: Diagnosis not present

## 2019-01-07 DIAGNOSIS — Z8052 Family history of malignant neoplasm of bladder: Secondary | ICD-10-CM

## 2019-01-07 DIAGNOSIS — I110 Hypertensive heart disease with heart failure: Secondary | ICD-10-CM | POA: Diagnosis present

## 2019-01-07 DIAGNOSIS — Z888 Allergy status to other drugs, medicaments and biological substances status: Secondary | ICD-10-CM

## 2019-01-07 HISTORY — PX: CORONARY ANGIOGRAPHY: CATH118303

## 2019-01-07 HISTORY — PX: CORONARY/GRAFT ACUTE MI REVASCULARIZATION: CATH118305

## 2019-01-07 LAB — COMPREHENSIVE METABOLIC PANEL
ALK PHOS: 41 U/L (ref 38–126)
ALT: 31 U/L (ref 0–44)
AST: 74 U/L — ABNORMAL HIGH (ref 15–41)
Albumin: 3.8 g/dL (ref 3.5–5.0)
Anion gap: 13 (ref 5–15)
BILIRUBIN TOTAL: 0.9 mg/dL (ref 0.3–1.2)
BUN: 16 mg/dL (ref 8–23)
CALCIUM: 8.4 mg/dL — AB (ref 8.9–10.3)
CO2: 23 mmol/L (ref 22–32)
CREATININE: 0.72 mg/dL (ref 0.44–1.00)
Chloride: 81 mmol/L — ABNORMAL LOW (ref 98–111)
GFR calc Af Amer: >60 mL/min (ref >60)
Glucose, Bld: 127 mg/dL — ABNORMAL HIGH (ref 70–99)
POTASSIUM: 4.6 mmol/L (ref 3.5–5.1)
Sodium: 117 mmol/L — CL (ref 135–145)
TOTAL PROTEIN: 6.2 g/dL — AB (ref 6.5–8.1)

## 2019-01-07 LAB — CBC
HEMATOCRIT: 40.3 % (ref 36.0–46.0)
Hemoglobin: 14.2 g/dL (ref 12.0–15.0)
MCH: 30.7 pg (ref 26.0–34.0)
MCHC: 35.2 g/dL (ref 30.0–36.0)
MCV: 87 fL (ref 80.0–100.0)
Platelets: 249 10*3/uL (ref 150–400)
RBC: 4.63 MIL/uL (ref 3.87–5.11)
RDW: 12 % (ref 11.5–15.5)
WBC: 10.7 10*3/uL — ABNORMAL HIGH (ref 4.0–10.5)
nRBC: 0 % (ref 0.0–0.2)

## 2019-01-07 LAB — POCT ACTIVATED CLOTTING TIME: Activated Clotting Time: 290 seconds

## 2019-01-07 LAB — TROPONIN I: TROPONIN I: 5.82 ng/mL — AB (ref <0.03)

## 2019-01-07 SURGERY — CORONARY/GRAFT ACUTE MI REVASCULARIZATION
Anesthesia: Moderate Sedation

## 2019-01-07 MED ORDER — NITROGLYCERIN 1 MG/10 ML FOR IR/CATH LAB
INTRA_ARTERIAL | Status: DC | PRN
  Administered 2019-01-07: 300 ug via INTRACORONARY

## 2019-01-07 MED ORDER — HEPARIN (PORCINE) IN NACL 1000-0.9 UT/500ML-% IV SOLN
INTRAVENOUS | Status: DC | PRN
  Administered 2019-01-07: 1000 mL

## 2019-01-07 MED ORDER — ETOMIDATE 2 MG/ML IV SOLN
INTRAVENOUS | Status: DC | PRN
  Administered 2019-01-07: 20 mg via INTRAVENOUS

## 2019-01-07 MED ORDER — ASPIRIN 81 MG PO CHEW
CHEWABLE_TABLET | ORAL | Status: AC
  Filled 2019-01-07: qty 4

## 2019-01-07 MED ORDER — FUROSEMIDE 10 MG/ML IJ SOLN
INTRAMUSCULAR | Status: AC
Start: 2019-01-07 — End: ?
  Filled 2019-01-07: qty 4

## 2019-01-07 MED ORDER — METOPROLOL TARTRATE 5 MG/5ML IV SOLN
INTRAVENOUS | Status: DC | PRN
  Administered 2019-01-07: 2.5 mg via INTRAVENOUS

## 2019-01-07 MED ORDER — TICAGRELOR 90 MG PO TABS
ORAL_TABLET | ORAL | Status: AC
  Filled 2019-01-07: qty 2

## 2019-01-07 MED ORDER — ATROPINE SULFATE 1 MG/10ML IJ SOSY
PREFILLED_SYRINGE | INTRAMUSCULAR | Status: AC
  Filled 2019-01-07: qty 10

## 2019-01-07 MED ORDER — BIVALIRUDIN TRIFLUOROACETATE 250 MG IV SOLR
INTRAVENOUS | Status: AC
  Filled 2019-01-07: qty 250

## 2019-01-07 MED ORDER — METOPROLOL SUCCINATE ER 50 MG PO TB24: 25.0000 mg | ORAL_TABLET | Freq: Every day | ORAL | Status: DC

## 2019-01-07 MED ORDER — SODIUM BICARBONATE 8.4 % IV SOLN
INTRAVENOUS | Status: DC | PRN
  Administered 2019-01-07: 100 meq via INTRAVENOUS

## 2019-01-07 MED ORDER — FUROSEMIDE 10 MG/ML IJ SOLN
INTRAMUSCULAR | Status: DC | PRN
  Administered 2019-01-07: 40 mg via INTRAVENOUS

## 2019-01-07 MED ORDER — ATORVASTATIN CALCIUM 20 MG PO TABS
40.0000 mg | ORAL_TABLET | Freq: Every day | ORAL | Status: DC
  Filled 2019-01-07: qty 2

## 2019-01-07 MED ORDER — IOPAMIDOL (ISOVUE-300) INJECTION 61%
INTRAVENOUS | Status: DC | PRN
  Administered 2019-01-07: 175 mL

## 2019-01-07 MED ORDER — ASPIRIN 81 MG PO CHEW
CHEWABLE_TABLET | ORAL | Status: DC | PRN
  Administered 2019-01-07: 324 mg via ORAL

## 2019-01-07 MED ORDER — BIVALIRUDIN BOLUS VIA INFUSION - CUPID
INTRAVENOUS | Status: DC | PRN
  Administered 2019-01-07: 47.7 mg via INTRAVENOUS

## 2019-01-07 MED ORDER — EPINEPHRINE PF 1 MG/10ML IJ SOSY
PREFILLED_SYRINGE | INTRAMUSCULAR | Status: DC | PRN
  Administered 2019-01-07 (×6): 1 mg via INTRAVENOUS

## 2019-01-07 MED ORDER — METHYLPREDNISOLONE SODIUM SUCC 125 MG IJ SOLR
INTRAMUSCULAR | Status: DC | PRN
  Administered 2019-01-07: 125 mg via INTRAVENOUS

## 2019-01-07 MED ORDER — SODIUM CHLORIDE 0.9 % IV SOLN
INTRAVENOUS | Status: DC | PRN
  Administered 2019-01-07: 1.75 mg/kg/h via INTRAVENOUS

## 2019-01-07 MED ORDER — DIPHENHYDRAMINE HCL 50 MG/ML IJ SOLN
INTRAMUSCULAR | Status: AC
  Filled 2019-01-07: qty 1

## 2019-01-07 MED ORDER — CALCIUM CHLORIDE 10 % IV SOLN
INTRAVENOUS | Status: DC | PRN
  Administered 2019-01-07: 1 g via INTRAVENOUS

## 2019-01-07 MED ORDER — DIPHENHYDRAMINE HCL 50 MG/ML IJ SOLN
INTRAMUSCULAR | Status: DC | PRN
  Administered 2019-01-07: 25 mg via INTRAVENOUS

## 2019-01-07 MED ORDER — TICAGRELOR 90 MG PO TABS
ORAL_TABLET | ORAL | Status: DC | PRN
  Administered 2019-01-07: 180 mg via ORAL

## 2019-01-07 MED ORDER — METOPROLOL TARTRATE 5 MG/5ML IV SOLN
INTRAVENOUS | Status: AC
  Filled 2019-01-07: qty 5

## 2019-01-07 MED ORDER — NITROGLYCERIN 5 MG/ML IV SOLN
INTRAVENOUS | Status: AC
  Filled 2019-01-07: qty 10

## 2019-01-07 MED ORDER — ATROPINE SULFATE 1 MG/10ML IJ SOSY
PREFILLED_SYRINGE | INTRAMUSCULAR | Status: DC | PRN
  Administered 2019-01-07: 1 mg via INTRAVENOUS

## 2019-01-07 MED ORDER — METHYLPREDNISOLONE SODIUM SUCC 125 MG IJ SOLR
INTRAMUSCULAR | Status: AC
  Filled 2019-01-07: qty 2

## 2019-01-07 MED ORDER — ROCURONIUM BROMIDE 50 MG/5ML IV SOLN
INTRAVENOUS | Status: DC | PRN
  Administered 2019-01-07: 80 mg via INTRAVENOUS

## 2019-01-07 SURGICAL SUPPLY — 16 items
BALLN TREK RX 2.5X12 (BALLOONS) ×3
BALLOON TREK RX 2.5X12 (BALLOONS) ×1 IMPLANT
CATH INFINITI 5FR ANG PIGTAIL (CATHETERS) ×3 IMPLANT
CATH INFINITI JR4 5F (CATHETERS) ×3 IMPLANT
CATH VISTA GUIDE 6FR XB3.5 (CATHETERS) ×3 IMPLANT
DEVICE CLOSURE MYNXGRIP 6/7F (Vascular Products) ×3 IMPLANT
DEVICE INFLAT 30 PLUS (MISCELLANEOUS) ×3 IMPLANT
KIT MANI 3VAL PERCEP (MISCELLANEOUS) ×3 IMPLANT
NEEDLE PERC 18GX7CM (NEEDLE) ×3 IMPLANT
PACK CARDIAC CATH (CUSTOM PROCEDURE TRAY) ×3 IMPLANT
SHEATH AVANTI 6FR X 11CM (SHEATH) ×3 IMPLANT
STENT RESOLUTE ONYX 3.0X15 (Permanent Stent) ×3 IMPLANT
SYR MEDRAD MARK V 150ML (SYRINGE) ×3 IMPLANT
WIRE ASAHI PROWATER 180CM (WIRE) ×3 IMPLANT
WIRE EMERALD ST .035X150CM (WIRE) ×3 IMPLANT
WIRE GUIDERIGHT .035X150 (WIRE) ×3 IMPLANT

## 2019-01-08 ENCOUNTER — Encounter: Payer: Self-pay | Admitting: Cardiology

## 2019-01-08 NOTE — Progress Notes (Signed)
Patient's body left with [facility] [place], [facility] to facilitate post-mortem care. Family at bedside.

## 2019-01-08 NOTE — Progress Notes (Signed)
The patient underwent successful cardiac catheterization and PCI of LAD.  She developed a small hematoma which was stabilized with manual compression.  The patient was transferred from the Cath Lab table to the stretcher.  She complained of shortness of breath at 23:00, oxygen saturations remained 90% and above.  Then the patient abruptly stopped breathing and became pulseless.  The monitor revealed junctional rhythm at 60 bpm.  CODE BLUE was called, the patient received atropine and epinephrine.  Chest compressions were started, and bag to mouth respirations were begun.  When the code team arrival, the patient was intubated.  She received multiple doses of epinephrine, with bicarbonate and calcium carbonate.  Patient remained in pulseless electrical activity and CODE BLUE was called at 23:27.

## 2019-01-21 NOTE — ED Provider Notes (Addendum)
[facility] Emergency Department Provider Note  Time seen: 9:32 PM  I have reviewed the triage vital signs and the nursing notes.   HISTORY  Chief Complaint STEMI Altered mental status  HPI Danielle Williamson is a 85 y.o. female with a past medical history of a thoracic aneurysm, anxiety, CAD, constipation, COPD, CHF, hypertension, hyperlipidemia, presents to the emergency department initially for altered mental status found to have possible STEMI on EKG.  According EMS they were called out to the patient's residence by family for some confusion and altered mental status.  Patient states she has been experiencing chest pain and cough for the past 1 week.  Patient recently saw her pulmonologist 2 days ago was started on an antibiotic per report.  Currently patient is somewhat confused, unable to tell me the year, she is oriented to person and place.  Unable to get an adequate/accurate review of systems from the patient.  No reported fever.   Past Medical History:  Diagnosis Date  . Allergy   . Aneurysm ([place])    thoracic aortic aneurysm  . Aneurysm, thoracic aortic (HCC) Abdominal Aneurysm  . Anxiety   . Aortic insufficiency   . Arthritis   . Asthma   . CAD (coronary artery disease)    no h/o stents or CABG  . Cataract Bilateral Eyes  . Constipation   . COPD (chronic obstructive pulmonary disease) (HCC)    on nocturnal o2  . Diastolic CHF ([place])   . History of adenomatous polyp of colon   . History of uterine cancer   . Hyperlipidemia   . Hypertension   . Pulmonary emphysema ([place])   . Skin cancer   . Thyroid disease    hypo  . UTI (lower urinary tract infection)     Patient Active Problem List   Diagnosis Date Noted  . Lymphedema 08/05/2017  . Atherosclerosis of native arteries of extremity with intermittent claudication ([place]) 07/20/2017  . Venous ulcer of ankle, left ([place]) 07/16/2017  . Chronic venous insufficiency 07/16/2017  . Aneurysm of thoracic  aorta ([place]) 07/09/2017  . Right lower lobe pneumonia ([place]) 10/29/2015  . Pleuritic chest pain 10/29/2015  . Dyspnea 10/29/2015  . General weakness 10/29/2015  . Leukocytosis 10/29/2015  . Sepsis ([place]) 10/27/2015    Past Surgical History:  Procedure Laterality Date  . ABDOMINAL HYSTERECTOMY    . BREAST CYST ASPIRATION Left 1985   neg  . CHOLECYSTECTOMY    . SHOULDER ARTHROSCOPY W/ CAPSULAR REPAIR     Left  . TOE SURGERY     Right toe surgery    Prior to Admission medications   Medication Sig Start Date End Date Taking? Authorizing Provider  albuterol (PROVENTIL HFA;VENTOLIN HFA) 108 (90 Base) MCG/ACT inhaler Inhale 2 puffs into the lungs every 6 (six) hours as needed for wheezing.    [provider]  albuterol (PROVENTIL) (2.5 MG/3ML) 0.083% nebulizer solution Take 2.5 mg by nebulization every 6 (six) hours as needed for wheezing.    [provider]  ALPRAZolam Duanne Moron) 0.5 MG tablet  05/24/17   [provider]  amLODipine (NORVASC) 5 MG tablet Take 1 tablet by mouth daily. 10/10/15   [provider]  atorvastatin (LIPITOR) 20 MG tablet Take 1 tablet by mouth daily. 08/08/15   [provider]  bisacodyl (DULCOLAX) 10 MG suppository Place 1 suppository (10 mg total) rectally daily. 10/29/15   Theodoro Grist, MD  Calcium Carb-Cholecalciferol (CALCIUM-VITAMIN D) 500-200 MG-UNIT tablet Take by mouth.  [provider]  calcium-vitamin D (OSCAL WITH D) 500-200 MG-UNIT TABS tablet Take by mouth.    [provider]  chlorpheniramine-HYDROcodone (TUSSIONEX) 10-8 MG/5ML SUER Take 5 mLs by mouth every 12 (twelve) hours as needed for cough. 10/29/15   Theodoro Grist, MD  Cholecalciferol (VITAMIN D3) 5000 units TABS Take 1 tablet by mouth daily.    [provider]  docusate sodium (COLACE) 100 MG capsule Take 1 capsule (100 mg total) by mouth 2 (two) times daily. 10/29/15   Theodoro Grist, MD  fluticasone (FLOVENT HFA) 220 MCG/ACT  inhaler Inhale 1 puff into the lungs 2 (two) times daily.    [provider]  furosemide (LASIX) 20 MG tablet Take 1 tablet by mouth daily. 09/14/15   [provider]  guaiFENesin (MUCINEX) 600 MG 12 hr tablet Take 1 tablet (600 mg total) by mouth 2 (two) times daily. 10/29/15   Theodoro Grist, MD  hydrocortisone 2.5 % cream Apply 1 application topically 2 (two) times daily.    [provider]  ketoconazole (NIZORAL) 2 % shampoo Apply 1 application topically 2 (two) times a week.    [provider]  levothyroxine (SYNTHROID, LEVOTHROID) 88 MCG tablet  06/15/17   [provider]  meloxicam (MOBIC) 7.5 MG tablet  07/08/17   [provider]  metoprolol succinate (TOPROL-XL) 25 MG 24 hr tablet Take by mouth. 07/24/17 07/24/18  [provider]  mupirocin ointment (BACTROBAN) 2 % Apply two times a day for 7 days. 07/03/17   Marylene Land, NP  potassium chloride SA (K-DUR,KLOR-CON) 20 MEQ tablet Take 1 tablet by mouth daily. 09/14/15   [provider]  predniSONE (DELTASONE) 5 MG tablet Take 1 tablet by mouth daily. 10/25/15   [provider]  Probiotic Product (PROBIOTIC-10 ULTIMATE PO) Take by mouth.    [provider]  Probiotic Product (PROBIOTIC-10) CAPS Take by mouth.    [provider]  sertraline (ZOLOFT) 50 MG tablet  06/13/17   [provider]  SPIRIVA RESPIMAT 2.5 MCG/ACT AERS Take 2 puffs by mouth daily. 10/25/15   [provider]  SYMBICORT 160-4.5 MCG/ACT inhaler  06/25/17   [provider]  torsemide (DEMADEX) 20 MG tablet Take by mouth.    [provider]  triamcinolone cream (KENALOG) 0.1 %  05/14/17   [provider]    Allergies  Allergen Reactions  . Epinephrine   . Furosemide     Other reaction(s): Other (See Comments)  . Lopid [Gemfibrozil] Other (See Comments)    Other reaction(s): Muscle Pain Reaction: Muscle pain  . Lovastatin Other (See  Comments)    Other reaction(s): Muscle Pain Reaction: Muscle pain  . Neosporin [Neomycin-Bacitracin Zn-Polymyx]   . Polysporin [Bacitracin-Polymyxin B]   . Sertraline Nausea Only  . Iodine Rash    Hives per patient (10/27/15)   . Moxifloxacin Palpitations  . Penicillin V Potassium Rash  . Penicillins Rash  . Sulfa Antibiotics Rash  . Vesicare [Solifenacin] Other (See Comments) and Palpitations    Reaction: Palpations    Family History  Problem Relation Age of Onset  . Breast cancer Cousin   . Heart attack Mother   . Hypertension Mother   . Bladder Cancer Father   . Prostate cancer Father   . Diabetes Sister   . Prostate cancer Brother   . AAA (abdominal aortic aneurysm) Brother     Social History Social History   Tobacco Use  . Smoking status: Former Smoker  Years: 20.00  . Smokeless tobacco: Never Used  . Tobacco comment: Quit 2 years ago.   Substance Use Topics  . Alcohol use: Yes    Alcohol/week: 0.0 standard drinks    Comment: rarely  . Drug use: No    Review of Systems Unable to obtain an accurate/adequate review of systems secondary to altered mental status/confusion  ____________________________________________   PHYSICAL EXAM:  VITAL SIGNS: ED Triage Vitals  Enc Vitals Group     BP 2019-01-17 2121 (!) 153/97     Pulse Rate 2019/01/17 2121 (!) 101     Resp 17-Jan-2019 2121 (!) 22     Temp 2019/01/17 2121 (!) 97.5 F (36.4 C)     Temp Source 01-17-2019 2121 Oral     SpO2 01-17-2019 2121 98 %     Weight 2019/01/17 2122 140 lb 3.4 oz (63.6 kg)     Height January 17, 2019 2122 5\' 5"  (1.651 m)     Head Circumference --      Peak Flow --      Pain Score 01/17/19 2122 0     Pain Loc --      Pain Edu? --      Excl. in [place]? --    Constitutional: Patient is awake and alert, oriented to person and place only.  Seems confused.  Having difficulty answering questions. Eyes: Normal exam ENT   Head: Normocephalic and atraumatic.   Mouth/Throat: Mucous membranes are  moist. Cardiovascular: Normal rate, regular rhythm.  Respiratory: Normal respiratory effort without tachypnea nor retractions. Breath sounds are clear Gastrointestinal: Soft and nontender. No distention.   Musculoskeletal: Nontender with normal range of motion in all extremities. Neurologic:  Normal speech and language. No gross focal neurologic deficits  Skin:  Skin is warm, dry and intact.  Psychiatric: Mood and affect are normal.   ____________________________________________    EKG  EKG viewed and interpreted by myself shows sinus tachycardia 103 bpm with a narrow QRS, normal axis, largely normal intervals.  Patient has significant ST elevation in the lateral leads concerning for STEMI.  ____________________________________________   INITIAL IMPRESSION / ASSESSMENT AND PLAN / ED COURSE  Pertinent labs & imaging results that were available during my care of the patient were reviewed by me and considered in my medical decision making (see chart for details).  Patient presents to the emergency department for altered mental status today per family report.  No history of altered mental status normally.  Patient does have a history of COPD, anxiety does take Xanax.  Also has a history of a thoracic aortic aneurysm.  Given her complex history I discussed with cardiology we have decided to proceed with CT imaging of the chest to evaluate while awaiting family for additional history.  Family does arrive.  Dr. Saralyn Pilar is at bedside, knows the patient and family well.  Has decided to proceed with cardiac catheterization prior to CT imaging.  Patient will be taken emergently to the cardiac catheterization lab.  We will discuss with the hospitalist for admission to the hospital following her catheterization given her altered mental status and possible secondary causes.  CRITICAL CARE Performed by: Harvest Dark   Total critical care time: 30 minutes  Critical care time was exclusive of  separately billable procedures and treating other patients.  Critical care was necessary to treat or prevent imminent or life-threatening deterioration.  Critical care was time spent personally by me on the following activities: development of treatment plan with patient and/or surrogate as well as  nursing, discussions with consultants, evaluation of patient's response to treatment, examination of patient, obtaining history from patient or surrogate, ordering and performing treatments and interventions, ordering and review of laboratory studies, ordering and review of radiographic studies, pulse oximetry and re-evaluation of patient's condition.   ____________________________________________   FINAL CLINICAL IMPRESSION(S) / ED DIAGNOSES  STEMI Confusion   Harvest Dark, MD 01-19-19 2149    Harvest Dark, MD 01/19/19 2350

## 2019-01-21 NOTE — ED Triage Notes (Signed)
Pt arrived from home via EMS with complaints from family of being altered. Pt is actively having a STEMI according to EMS EKG. Pt was recently diagnosed with bronchitis and finished her antibiotics on yesterday. Pt has Hx of COPD and aortic aneurysm. Pt had chest pain on yesterday. Pt is confused and has cough. Pt denies any pain. VS per EMS BP-148/60 O2sat-97% on 2L. Pt is always on 2L.

## 2019-01-21 NOTE — Consult Note (Signed)
[facility] Cardiology  CARDIOLOGY CONSULT NOTE  Patient ID: Danielle Williamson MRN: [id number] DOB/AGE: [birth year] 83 y.o.  Admit date: 01-12-2019 Referring [place] Primary Physician [facility] Primary Cardiologist Croy Drumwright Reason for Consultation anterior STEMI  HPI: 85 year old female referred for evaluation of anterior ST elevation myocardial infarction.  The patient has a history of mild to moderate aortic stenosis, chronic diastolic congestive heart failure, descending thoracic aortic aneurysm and COPD.  The patient presents with a one-week history of persistent shortness of breath and cough.  Today, the daughter noted that patient was confused and EMS was called.  ECG in route revealed the ST elevations in leads V3 through V6 consistent with anterior ST elevation myocardial infarction.  Upon questioning, the patient reported experiencing chest pain yesterday though she denies chest pain today.  Review of systems complete and found to be negative unless listed above     Past Medical History:  Diagnosis Date  . Allergy   . Aneurysm ([place])    thoracic aortic aneurysm  . Aneurysm, thoracic aortic (HCC) Abdominal Aneurysm  . Anxiety   . Aortic insufficiency   . Arthritis   . Asthma   . CAD (coronary artery disease)    no h/o stents or CABG  . Cataract Bilateral Eyes  . Constipation   . COPD (chronic obstructive pulmonary disease) (HCC)    on nocturnal o2  . Diastolic CHF ([place])   . History of adenomatous polyp of colon   . History of uterine cancer   . Hyperlipidemia   . Hypertension   . Pulmonary emphysema ([place])   . Skin cancer   . Thyroid disease    hypo  . UTI (lower urinary tract infection)     Past Surgical History:  Procedure Laterality Date  . ABDOMINAL HYSTERECTOMY    . BREAST CYST ASPIRATION Left 1985   neg  . CHOLECYSTECTOMY    . SHOULDER ARTHROSCOPY W/ CAPSULAR REPAIR     Left  . TOE SURGERY     Right toe surgery    Medications Prior to Admission   Medication Sig Dispense Refill Last Dose  . albuterol (PROVENTIL HFA;VENTOLIN HFA) 108 (90 Base) MCG/ACT inhaler Inhale 2 puffs into the lungs every 6 (six) hours as needed for wheezing.   Taking  . albuterol (PROVENTIL) (2.5 MG/3ML) 0.083% nebulizer solution Take 2.5 mg by nebulization every 6 (six) hours as needed for wheezing.   Taking  . ALPRAZolam (XANAX) 0.5 MG tablet   1 Taking  . amLODipine (NORVASC) 5 MG tablet Take 1 tablet by mouth daily.  4 Taking  . atorvastatin (LIPITOR) 20 MG tablet Take 1 tablet by mouth daily.  2 Taking  . bisacodyl (DULCOLAX) 10 MG suppository Place 1 suppository (10 mg total) rectally daily. 12 suppository 0 Taking  . Calcium Carb-Cholecalciferol (CALCIUM-VITAMIN D) 500-200 MG-UNIT tablet Take by mouth.   Taking  . calcium-vitamin D (OSCAL WITH D) 500-200 MG-UNIT TABS tablet Take by mouth.   Taking  . chlorpheniramine-HYDROcodone (TUSSIONEX) 10-8 MG/5ML SUER Take 5 mLs by mouth every 12 (twelve) hours as needed for cough. 140 mL 0 Taking  . Cholecalciferol (VITAMIN D3) 5000 units TABS Take 1 tablet by mouth daily.   Taking  . docusate sodium (COLACE) 100 MG capsule Take 1 capsule (100 mg total) by mouth 2 (two) times daily. 10 capsule 0 Taking  . fluticasone (FLOVENT HFA) 220 MCG/ACT inhaler Inhale 1 puff into the lungs 2 (two) times daily.   Taking  . furosemide (LASIX) 20 MG  tablet Take 1 tablet by mouth daily.  5 Taking  . guaiFENesin (MUCINEX) 600 MG 12 hr tablet Take 1 tablet (600 mg total) by mouth 2 (two) times daily. 20 tablet 2 Taking  . hydrocortisone 2.5 % cream Apply 1 application topically 2 (two) times daily.   Taking  . ketoconazole (NIZORAL) 2 % shampoo Apply 1 application topically 2 (two) times a week.   Taking  . levothyroxine (SYNTHROID, LEVOTHROID) 88 MCG tablet   10 Taking  . meloxicam (MOBIC) 7.5 MG tablet   0 Taking  . metoprolol succinate (TOPROL-XL) 25 MG 24 hr tablet Take by mouth.   Taking  . mupirocin ointment (BACTROBAN) 2 %  Apply two times a day for 7 days. 22 g 0 Taking  . potassium chloride SA (K-DUR,KLOR-CON) 20 MEQ tablet Take 1 tablet by mouth daily.  5 Taking  . predniSONE (DELTASONE) 5 MG tablet Take 1 tablet by mouth daily.  1 Taking  . Probiotic Product (PROBIOTIC-10 ULTIMATE PO) Take by mouth.   Taking  . Probiotic Product (PROBIOTIC-10) CAPS Take by mouth.   Taking  . sertraline (ZOLOFT) 50 MG tablet   10 Taking  . SPIRIVA RESPIMAT 2.5 MCG/ACT AERS Take 2 puffs by mouth daily.  10 Taking  . SYMBICORT 160-4.5 MCG/ACT inhaler   11 Taking  . torsemide (DEMADEX) 20 MG tablet Take by mouth.   Taking  . triamcinolone cream (KENALOG) 0.1 %   0 Taking   Social History   Socioeconomic History  . Marital status: Married    Spouse name: Not on file  . Number of children: Not on file  . Years of education: Not on file  . Highest education level: Not on file  Occupational History  . Not on file  Social Needs  . Financial resource strain: Not on file  . Food insecurity:    Worry: Not on file    Inability: Not on file  . Transportation needs:    Medical: Not on file    Non-medical: Not on file  Tobacco Use  . Smoking status: Former Smoker    Years: 20.00  . Smokeless tobacco: Never Used  . Tobacco comment: Quit 2 years ago.   Substance and Sexual Activity  . Alcohol use: Yes    Alcohol/week: 0.0 standard drinks    Comment: rarely  . Drug use: No  . Sexual activity: Not on file  Lifestyle  . Physical activity:    Days per week: Not on file    Minutes per session: Not on file  . Stress: Not on file  Relationships  . Social connections:    Talks on phone: Not on file    Gets together: Not on file    Attends religious service: Not on file    Active member of club or organization: Not on file    Attends meetings of clubs or organizations: Not on file    Relationship status: Not on file  . Intimate partner violence:    Fear of current or ex partner: Not on file    Emotionally abused: Not on  file    Physically abused: Not on file    Forced sexual activity: Not on file  Other Topics Concern  . Not on file  Social History Narrative   Lives at home independently. Sister lives next door.    Family History  Problem Relation Age of Onset  . Breast cancer Cousin   . Heart attack Mother   . Hypertension Mother   .  Bladder Cancer Father   . Prostate cancer Father   . Diabetes Sister   . Prostate cancer Brother   . AAA (abdominal aortic aneurysm) Brother       Review of systems complete and found to be negative unless listed above      PHYSICAL EXAM  General: Well developed, well nourished, in no acute distress HEENT:  Normocephalic and atramatic Neck:  No JVD.  Lungs: Clear bilaterally to auscultation and percussion. Heart: HRRR . Normal S1 and S2 without gallops or murmurs.  Abdomen: Bowel sounds are positive, abdomen soft and non-tender  Msk:  Back normal, normal gait. Normal strength and tone for age. Extremities: No clubbing, cyanosis or edema.   Neuro: Alert and oriented X 3. Psych:  Good affect, responds appropriately  Labs:   Lab Results  Component Value Date   WBC 10.7 (H) 2019/01/18   HGB 14.2 January 18, 2019   HCT 40.3 18-Jan-2019   MCV 87.0 01/18/2019   PLT 249 01-18-2019    Recent Labs  Lab 01/18/19 2120  NA 117*  K 4.6  CL 81*  CO2 23  BUN 16  CREATININE 0.72  CALCIUM 8.4*  PROT 6.2*  BILITOT 0.9  ALKPHOS 41  ALT 31  AST 74*  GLUCOSE 127*   Lab Results  Component Value Date   TROPONINI 5.82 ([place]) 18-Jan-2019   No results found for: CHOL No results found for: HDL No results found for: LDLCALC No results found for: TRIG No results found for: CHOLHDL No results found for: LDLDIRECT    Radiology: Mr Jodene Nam Chest Wo Contrast  Result Date: 01/05/2019 CLINICAL DATA:  Shortness of breath, chest pain x1 day, history of aortic aneurysm, rule out dissection without EXAM: MRA CHEST WITH OR WITHOUT CONTRAST TECHNIQUE: Angiographic images of the  chest were obtained using MRA technique without intravenous contrast. CONTRAST:  None COMPARISON:  CT 04/29/2018 FINDINGS: VASCULAR Aorta: Aortic root, ascending segment, and aortic arch are normal in caliber without evidence of dissection or stenosis. Patent visualized proximal brachiocephalic vessels. There is fusiform dilatation in the mid descending thoracic segment, measuring up to 4.1 cm maximum transverse diameter (previously 4.1 cm), with some eccentric nonocclusive mural thrombus in the aneurysmal segment as before. Distal descending thoracic segment returns to normal caliber above the diaphragm. Atheromatous irregularity in the visualized proximal abdominal aorta , limited evaluation. Heart: Normal in size.  No pericardial effusion or hemopericardium. Pulmonary Arteries: Patent centrally. Limited evaluation of segmental and subsegmental branches. NON-VASCULAR No pleural effusion. No suggestion of mediastinal hematoma. No acute findings in the visualized upper abdomen. No significant musculoskeletal or chest wall pathology. IMPRESSION: VASCULAR 1. No evidence of aortic dissection or rupture. 2. Stable 4.1 cm fusiform descending thoracic aortic aneurysm. NON-VASCULAR 1. No acute findings Electronically Signed   By: Lucrezia Europe M.D.   On: 01/05/2019 15:58    EKG: Sinus rhythm with ST elevation in leads V 3 through V6  ASSESSMENT AND PLAN:   1.  Anterior ST elevation myocardial infarction 2.  COPD exacerbation, probable bronchitis  Recommendations  1.  Emergent cardiac catheterization, and probable primary PCI    Signed: Isaias Cowman MD,PhD, [facility] Jan 18, 2019, 10:58 PM

## 2019-01-21 NOTE — Progress Notes (Signed)
   January 18, 2019 2300  Clinical Encounter Type  Visited With Family;Health care provider  Visit Type Code  Referral From Nurse  Spiritual Encounters  Spiritual Needs Prayer;Emotional;Grief support  Stress Factors  Family Stress Factors Health changes;Lack of caregivers   Chaplain received a page for a code stroke. Chaplain maintained pastoral presence and offered silent prayer outside of the patient's room while the medical team made assessment. The patient was taken to the Cath lab for cardiac catheterization and the chaplain waited with her daughter, Danielle Williamson. We talked about the events that led to her mother's arrival in the ED, her mother and father, family, her life's work, Social research officer, government. Clinical biochemist offered compassionate care and listening while awaiting news. The physician updated Danielle Williamson about her mother's status and imminent transfer to the ICU and we prepared to go upstairs to the waiting area there. A code blue was then called which resulted in the patient's unexpected death. Chaplain continued to provide support and care to the family.

## 2019-01-21 DEATH — deceased

## 2023-09-14 ENCOUNTER — Other Ambulatory Visit (HOSPITAL_BASED_OUTPATIENT_CLINIC_OR_DEPARTMENT_OTHER): Payer: Self-pay
